# Patient Record
Sex: Female | Born: 1950
Health system: Southern US, Community
[De-identification: ages and names within clinical notes are randomized; demographics above are authoritative.]

## PROBLEM LIST (undated history)

## (undated) DIAGNOSIS — M1990 Unspecified osteoarthritis, unspecified site: Secondary | ICD-10-CM

## (undated) DIAGNOSIS — J45909 Unspecified asthma, uncomplicated: Secondary | ICD-10-CM

## (undated) DIAGNOSIS — R0600 Dyspnea, unspecified: Secondary | ICD-10-CM

## (undated) DIAGNOSIS — K219 Gastro-esophageal reflux disease without esophagitis: Secondary | ICD-10-CM

## (undated) DIAGNOSIS — I10 Essential (primary) hypertension: Secondary | ICD-10-CM

## (undated) HISTORY — PX: TUBAL LIGATION: SHX77

---

## 2014-10-25 DIAGNOSIS — R87619 Unspecified abnormal cytological findings in specimens from cervix uteri: Secondary | ICD-10-CM | POA: Diagnosis not present

## 2014-11-29 DIAGNOSIS — R87619 Unspecified abnormal cytological findings in specimens from cervix uteri: Secondary | ICD-10-CM | POA: Diagnosis not present

## 2015-07-25 DIAGNOSIS — R87610 Atypical squamous cells of undetermined significance on cytologic smear of cervix (ASC-US): Secondary | ICD-10-CM | POA: Diagnosis not present

## 2015-07-25 DIAGNOSIS — R87810 Cervical high risk human papillomavirus (HPV) DNA test positive: Secondary | ICD-10-CM | POA: Diagnosis not present

## 2015-08-05 DIAGNOSIS — I10 Essential (primary) hypertension: Secondary | ICD-10-CM | POA: Diagnosis not present

## 2015-08-05 DIAGNOSIS — Z6830 Body mass index (BMI) 30.0-30.9, adult: Secondary | ICD-10-CM | POA: Diagnosis not present

## 2015-08-05 DIAGNOSIS — M25511 Pain in right shoulder: Secondary | ICD-10-CM | POA: Diagnosis not present

## 2015-08-05 DIAGNOSIS — Z0000 Encounter for general adult medical examination without abnormal findings: Secondary | ICD-10-CM | POA: Diagnosis not present

## 2015-08-05 DIAGNOSIS — E784 Other hyperlipidemia: Secondary | ICD-10-CM | POA: Diagnosis not present

## 2015-08-28 DIAGNOSIS — Z780 Asymptomatic menopausal state: Secondary | ICD-10-CM | POA: Diagnosis not present

## 2015-08-28 DIAGNOSIS — Z7982 Long term (current) use of aspirin: Secondary | ICD-10-CM | POA: Diagnosis not present

## 2015-08-28 DIAGNOSIS — Z79899 Other long term (current) drug therapy: Secondary | ICD-10-CM | POA: Diagnosis not present

## 2015-08-28 DIAGNOSIS — I10 Essential (primary) hypertension: Secondary | ICD-10-CM | POA: Diagnosis not present

## 2015-08-28 DIAGNOSIS — K219 Gastro-esophageal reflux disease without esophagitis: Secondary | ICD-10-CM | POA: Diagnosis not present

## 2015-08-28 DIAGNOSIS — M8589 Other specified disorders of bone density and structure, multiple sites: Secondary | ICD-10-CM | POA: Diagnosis not present

## 2015-08-28 DIAGNOSIS — M810 Age-related osteoporosis without current pathological fracture: Secondary | ICD-10-CM | POA: Diagnosis not present

## 2015-08-28 DIAGNOSIS — J45909 Unspecified asthma, uncomplicated: Secondary | ICD-10-CM | POA: Diagnosis not present

## 2015-09-10 DIAGNOSIS — H2513 Age-related nuclear cataract, bilateral: Secondary | ICD-10-CM | POA: Diagnosis not present

## 2015-09-10 DIAGNOSIS — H538 Other visual disturbances: Secondary | ICD-10-CM | POA: Diagnosis not present

## 2015-11-26 DIAGNOSIS — I10 Essential (primary) hypertension: Secondary | ICD-10-CM | POA: Diagnosis not present

## 2015-11-26 DIAGNOSIS — Z6828 Body mass index (BMI) 28.0-28.9, adult: Secondary | ICD-10-CM | POA: Diagnosis not present

## 2015-11-26 DIAGNOSIS — S43421D Sprain of right rotator cuff capsule, subsequent encounter: Secondary | ICD-10-CM | POA: Diagnosis not present

## 2015-11-26 DIAGNOSIS — E784 Other hyperlipidemia: Secondary | ICD-10-CM | POA: Diagnosis not present

## 2016-02-12 DIAGNOSIS — I10 Essential (primary) hypertension: Secondary | ICD-10-CM | POA: Diagnosis not present

## 2016-02-12 DIAGNOSIS — E784 Other hyperlipidemia: Secondary | ICD-10-CM | POA: Diagnosis not present

## 2016-03-17 DIAGNOSIS — I10 Essential (primary) hypertension: Secondary | ICD-10-CM | POA: Diagnosis not present

## 2016-03-17 DIAGNOSIS — E784 Other hyperlipidemia: Secondary | ICD-10-CM | POA: Diagnosis not present

## 2016-03-30 DIAGNOSIS — Z6829 Body mass index (BMI) 29.0-29.9, adult: Secondary | ICD-10-CM | POA: Diagnosis not present

## 2016-03-30 DIAGNOSIS — I10 Essential (primary) hypertension: Secondary | ICD-10-CM | POA: Diagnosis not present

## 2016-03-30 DIAGNOSIS — E784 Other hyperlipidemia: Secondary | ICD-10-CM | POA: Diagnosis not present

## 2016-04-29 DIAGNOSIS — I10 Essential (primary) hypertension: Secondary | ICD-10-CM | POA: Diagnosis not present

## 2016-04-29 DIAGNOSIS — E784 Other hyperlipidemia: Secondary | ICD-10-CM | POA: Diagnosis not present

## 2016-06-01 DIAGNOSIS — E784 Other hyperlipidemia: Secondary | ICD-10-CM | POA: Diagnosis not present

## 2016-06-01 DIAGNOSIS — I10 Essential (primary) hypertension: Secondary | ICD-10-CM | POA: Diagnosis not present

## 2016-06-11 DIAGNOSIS — E784 Other hyperlipidemia: Secondary | ICD-10-CM | POA: Diagnosis not present

## 2016-06-11 DIAGNOSIS — J4520 Mild intermittent asthma, uncomplicated: Secondary | ICD-10-CM | POA: Diagnosis not present

## 2016-06-11 DIAGNOSIS — I10 Essential (primary) hypertension: Secondary | ICD-10-CM | POA: Diagnosis not present

## 2016-06-11 DIAGNOSIS — Z6829 Body mass index (BMI) 29.0-29.9, adult: Secondary | ICD-10-CM | POA: Diagnosis not present

## 2016-06-29 DIAGNOSIS — J4520 Mild intermittent asthma, uncomplicated: Secondary | ICD-10-CM | POA: Diagnosis not present

## 2016-06-29 DIAGNOSIS — E784 Other hyperlipidemia: Secondary | ICD-10-CM | POA: Diagnosis not present

## 2016-06-29 DIAGNOSIS — I10 Essential (primary) hypertension: Secondary | ICD-10-CM | POA: Diagnosis not present

## 2016-07-28 DIAGNOSIS — E784 Other hyperlipidemia: Secondary | ICD-10-CM | POA: Diagnosis not present

## 2016-07-28 DIAGNOSIS — J4520 Mild intermittent asthma, uncomplicated: Secondary | ICD-10-CM | POA: Diagnosis not present

## 2016-07-28 DIAGNOSIS — I10 Essential (primary) hypertension: Secondary | ICD-10-CM | POA: Diagnosis not present

## 2016-08-20 DIAGNOSIS — E784 Other hyperlipidemia: Secondary | ICD-10-CM | POA: Diagnosis not present

## 2016-08-20 DIAGNOSIS — J4520 Mild intermittent asthma, uncomplicated: Secondary | ICD-10-CM | POA: Diagnosis not present

## 2016-08-20 DIAGNOSIS — I10 Essential (primary) hypertension: Secondary | ICD-10-CM | POA: Diagnosis not present

## 2016-09-16 DIAGNOSIS — E784 Other hyperlipidemia: Secondary | ICD-10-CM | POA: Diagnosis not present

## 2016-09-16 DIAGNOSIS — I10 Essential (primary) hypertension: Secondary | ICD-10-CM | POA: Diagnosis not present

## 2016-09-16 DIAGNOSIS — J4520 Mild intermittent asthma, uncomplicated: Secondary | ICD-10-CM | POA: Diagnosis not present

## 2016-09-22 DIAGNOSIS — J4520 Mild intermittent asthma, uncomplicated: Secondary | ICD-10-CM | POA: Diagnosis not present

## 2016-09-22 DIAGNOSIS — E784 Other hyperlipidemia: Secondary | ICD-10-CM | POA: Diagnosis not present

## 2016-09-22 DIAGNOSIS — Z6829 Body mass index (BMI) 29.0-29.9, adult: Secondary | ICD-10-CM | POA: Diagnosis not present

## 2016-09-22 DIAGNOSIS — I10 Essential (primary) hypertension: Secondary | ICD-10-CM | POA: Diagnosis not present

## 2016-09-22 DIAGNOSIS — Z0000 Encounter for general adult medical examination without abnormal findings: Secondary | ICD-10-CM | POA: Diagnosis not present

## 2016-10-06 DIAGNOSIS — Z6831 Body mass index (BMI) 31.0-31.9, adult: Secondary | ICD-10-CM | POA: Diagnosis not present

## 2016-10-06 DIAGNOSIS — Z01419 Encounter for gynecological examination (general) (routine) without abnormal findings: Secondary | ICD-10-CM | POA: Diagnosis not present

## 2016-10-21 DIAGNOSIS — I10 Essential (primary) hypertension: Secondary | ICD-10-CM | POA: Diagnosis not present

## 2016-10-21 DIAGNOSIS — E784 Other hyperlipidemia: Secondary | ICD-10-CM | POA: Diagnosis not present

## 2016-12-01 DIAGNOSIS — I10 Essential (primary) hypertension: Secondary | ICD-10-CM | POA: Diagnosis not present

## 2016-12-01 DIAGNOSIS — E784 Other hyperlipidemia: Secondary | ICD-10-CM | POA: Diagnosis not present

## 2016-12-24 DIAGNOSIS — J4520 Mild intermittent asthma, uncomplicated: Secondary | ICD-10-CM | POA: Diagnosis not present

## 2016-12-24 DIAGNOSIS — Z6830 Body mass index (BMI) 30.0-30.9, adult: Secondary | ICD-10-CM | POA: Diagnosis not present

## 2016-12-24 DIAGNOSIS — I10 Essential (primary) hypertension: Secondary | ICD-10-CM | POA: Diagnosis not present

## 2016-12-24 DIAGNOSIS — E784 Other hyperlipidemia: Secondary | ICD-10-CM | POA: Diagnosis not present

## 2016-12-30 DIAGNOSIS — I10 Essential (primary) hypertension: Secondary | ICD-10-CM | POA: Diagnosis not present

## 2016-12-30 DIAGNOSIS — E784 Other hyperlipidemia: Secondary | ICD-10-CM | POA: Diagnosis not present

## 2017-03-23 DIAGNOSIS — E784 Other hyperlipidemia: Secondary | ICD-10-CM | POA: Diagnosis not present

## 2017-03-23 DIAGNOSIS — I10 Essential (primary) hypertension: Secondary | ICD-10-CM | POA: Diagnosis not present

## 2017-04-05 DIAGNOSIS — J4520 Mild intermittent asthma, uncomplicated: Secondary | ICD-10-CM | POA: Diagnosis not present

## 2017-04-05 DIAGNOSIS — I10 Essential (primary) hypertension: Secondary | ICD-10-CM | POA: Diagnosis not present

## 2017-04-05 DIAGNOSIS — E784 Other hyperlipidemia: Secondary | ICD-10-CM | POA: Diagnosis not present

## 2017-04-05 DIAGNOSIS — Z6831 Body mass index (BMI) 31.0-31.9, adult: Secondary | ICD-10-CM | POA: Diagnosis not present

## 2017-05-19 DIAGNOSIS — I10 Essential (primary) hypertension: Secondary | ICD-10-CM | POA: Diagnosis not present

## 2017-05-19 DIAGNOSIS — E7849 Other hyperlipidemia: Secondary | ICD-10-CM | POA: Diagnosis not present

## 2017-06-17 DIAGNOSIS — E7849 Other hyperlipidemia: Secondary | ICD-10-CM | POA: Diagnosis not present

## 2017-06-17 DIAGNOSIS — I10 Essential (primary) hypertension: Secondary | ICD-10-CM | POA: Diagnosis not present

## 2017-06-23 DIAGNOSIS — M1712 Unilateral primary osteoarthritis, left knee: Secondary | ICD-10-CM | POA: Diagnosis not present

## 2017-06-23 DIAGNOSIS — M25562 Pain in left knee: Secondary | ICD-10-CM | POA: Diagnosis not present

## 2017-07-20 DIAGNOSIS — I10 Essential (primary) hypertension: Secondary | ICD-10-CM | POA: Diagnosis not present

## 2017-07-20 DIAGNOSIS — E7849 Other hyperlipidemia: Secondary | ICD-10-CM | POA: Diagnosis not present

## 2017-08-04 DIAGNOSIS — J4520 Mild intermittent asthma, uncomplicated: Secondary | ICD-10-CM | POA: Diagnosis not present

## 2017-08-04 DIAGNOSIS — Z6832 Body mass index (BMI) 32.0-32.9, adult: Secondary | ICD-10-CM | POA: Diagnosis not present

## 2017-08-04 DIAGNOSIS — I10 Essential (primary) hypertension: Secondary | ICD-10-CM | POA: Diagnosis not present

## 2017-08-04 DIAGNOSIS — E7849 Other hyperlipidemia: Secondary | ICD-10-CM | POA: Diagnosis not present

## 2017-08-24 DIAGNOSIS — E7849 Other hyperlipidemia: Secondary | ICD-10-CM | POA: Diagnosis not present

## 2017-08-24 DIAGNOSIS — J4520 Mild intermittent asthma, uncomplicated: Secondary | ICD-10-CM | POA: Diagnosis not present

## 2017-08-24 DIAGNOSIS — I10 Essential (primary) hypertension: Secondary | ICD-10-CM | POA: Diagnosis not present

## 2017-09-22 DIAGNOSIS — J4520 Mild intermittent asthma, uncomplicated: Secondary | ICD-10-CM | POA: Diagnosis not present

## 2017-09-22 DIAGNOSIS — E7849 Other hyperlipidemia: Secondary | ICD-10-CM | POA: Diagnosis not present

## 2017-09-22 DIAGNOSIS — I10 Essential (primary) hypertension: Secondary | ICD-10-CM | POA: Diagnosis not present

## 2017-10-21 DIAGNOSIS — M25552 Pain in left hip: Secondary | ICD-10-CM | POA: Diagnosis not present

## 2017-10-21 DIAGNOSIS — M1712 Unilateral primary osteoarthritis, left knee: Secondary | ICD-10-CM | POA: Diagnosis not present

## 2017-10-27 DIAGNOSIS — E7849 Other hyperlipidemia: Secondary | ICD-10-CM | POA: Diagnosis not present

## 2017-10-27 DIAGNOSIS — I10 Essential (primary) hypertension: Secondary | ICD-10-CM | POA: Diagnosis not present

## 2017-10-27 DIAGNOSIS — J4520 Mild intermittent asthma, uncomplicated: Secondary | ICD-10-CM | POA: Diagnosis not present

## 2017-11-09 DIAGNOSIS — E7849 Other hyperlipidemia: Secondary | ICD-10-CM | POA: Diagnosis not present

## 2017-11-09 DIAGNOSIS — I10 Essential (primary) hypertension: Secondary | ICD-10-CM | POA: Diagnosis not present

## 2017-11-09 DIAGNOSIS — Z0000 Encounter for general adult medical examination without abnormal findings: Secondary | ICD-10-CM | POA: Diagnosis not present

## 2017-11-09 DIAGNOSIS — J4520 Mild intermittent asthma, uncomplicated: Secondary | ICD-10-CM | POA: Diagnosis not present

## 2017-11-09 DIAGNOSIS — Z6831 Body mass index (BMI) 31.0-31.9, adult: Secondary | ICD-10-CM | POA: Diagnosis not present

## 2017-11-09 DIAGNOSIS — Z1389 Encounter for screening for other disorder: Secondary | ICD-10-CM | POA: Diagnosis not present

## 2017-11-17 DIAGNOSIS — I10 Essential (primary) hypertension: Secondary | ICD-10-CM | POA: Diagnosis not present

## 2017-11-17 DIAGNOSIS — J4520 Mild intermittent asthma, uncomplicated: Secondary | ICD-10-CM | POA: Diagnosis not present

## 2017-11-17 DIAGNOSIS — E7849 Other hyperlipidemia: Secondary | ICD-10-CM | POA: Diagnosis not present

## 2017-12-16 DIAGNOSIS — M810 Age-related osteoporosis without current pathological fracture: Secondary | ICD-10-CM | POA: Diagnosis not present

## 2017-12-16 DIAGNOSIS — M8588 Other specified disorders of bone density and structure, other site: Secondary | ICD-10-CM | POA: Diagnosis not present

## 2018-01-31 DIAGNOSIS — E7849 Other hyperlipidemia: Secondary | ICD-10-CM | POA: Diagnosis not present

## 2018-01-31 DIAGNOSIS — I10 Essential (primary) hypertension: Secondary | ICD-10-CM | POA: Diagnosis not present

## 2018-01-31 DIAGNOSIS — J4520 Mild intermittent asthma, uncomplicated: Secondary | ICD-10-CM | POA: Diagnosis not present

## 2018-02-01 DIAGNOSIS — J45909 Unspecified asthma, uncomplicated: Secondary | ICD-10-CM | POA: Diagnosis not present

## 2018-02-01 DIAGNOSIS — Z79899 Other long term (current) drug therapy: Secondary | ICD-10-CM | POA: Diagnosis not present

## 2018-02-01 DIAGNOSIS — D6949 Other primary thrombocytopenia: Secondary | ICD-10-CM | POA: Diagnosis not present

## 2018-02-01 DIAGNOSIS — K219 Gastro-esophageal reflux disease without esophagitis: Secondary | ICD-10-CM | POA: Diagnosis not present

## 2018-02-01 DIAGNOSIS — M1990 Unspecified osteoarthritis, unspecified site: Secondary | ICD-10-CM | POA: Diagnosis not present

## 2018-02-01 DIAGNOSIS — K5289 Other specified noninfective gastroenteritis and colitis: Secondary | ICD-10-CM | POA: Diagnosis not present

## 2018-02-01 DIAGNOSIS — D696 Thrombocytopenia, unspecified: Secondary | ICD-10-CM | POA: Diagnosis not present

## 2018-02-01 DIAGNOSIS — I10 Essential (primary) hypertension: Secondary | ICD-10-CM | POA: Diagnosis not present

## 2018-02-01 DIAGNOSIS — K8020 Calculus of gallbladder without cholecystitis without obstruction: Secondary | ICD-10-CM | POA: Diagnosis not present

## 2018-02-01 DIAGNOSIS — E785 Hyperlipidemia, unspecified: Secondary | ICD-10-CM | POA: Diagnosis not present

## 2018-02-01 DIAGNOSIS — M150 Primary generalized (osteo)arthritis: Secondary | ICD-10-CM | POA: Diagnosis not present

## 2018-02-01 DIAGNOSIS — K529 Noninfective gastroenteritis and colitis, unspecified: Secondary | ICD-10-CM | POA: Diagnosis not present

## 2018-02-02 DIAGNOSIS — J45909 Unspecified asthma, uncomplicated: Secondary | ICD-10-CM | POA: Diagnosis not present

## 2018-02-02 DIAGNOSIS — M150 Primary generalized (osteo)arthritis: Secondary | ICD-10-CM | POA: Diagnosis not present

## 2018-02-02 DIAGNOSIS — Z79899 Other long term (current) drug therapy: Secondary | ICD-10-CM | POA: Diagnosis not present

## 2018-02-02 DIAGNOSIS — K5289 Other specified noninfective gastroenteritis and colitis: Secondary | ICD-10-CM | POA: Diagnosis not present

## 2018-02-02 DIAGNOSIS — D6949 Other primary thrombocytopenia: Secondary | ICD-10-CM | POA: Diagnosis not present

## 2018-02-02 DIAGNOSIS — D696 Thrombocytopenia, unspecified: Secondary | ICD-10-CM | POA: Diagnosis not present

## 2018-02-02 DIAGNOSIS — M1990 Unspecified osteoarthritis, unspecified site: Secondary | ICD-10-CM | POA: Diagnosis not present

## 2018-02-02 DIAGNOSIS — K8020 Calculus of gallbladder without cholecystitis without obstruction: Secondary | ICD-10-CM | POA: Diagnosis not present

## 2018-02-02 DIAGNOSIS — E785 Hyperlipidemia, unspecified: Secondary | ICD-10-CM | POA: Diagnosis not present

## 2018-02-02 DIAGNOSIS — K219 Gastro-esophageal reflux disease without esophagitis: Secondary | ICD-10-CM | POA: Diagnosis not present

## 2018-02-02 DIAGNOSIS — K529 Noninfective gastroenteritis and colitis, unspecified: Secondary | ICD-10-CM | POA: Diagnosis not present

## 2018-02-02 DIAGNOSIS — I10 Essential (primary) hypertension: Secondary | ICD-10-CM | POA: Diagnosis not present

## 2018-02-03 DIAGNOSIS — J45909 Unspecified asthma, uncomplicated: Secondary | ICD-10-CM | POA: Diagnosis not present

## 2018-02-03 DIAGNOSIS — K5289 Other specified noninfective gastroenteritis and colitis: Secondary | ICD-10-CM | POA: Diagnosis not present

## 2018-02-03 DIAGNOSIS — M150 Primary generalized (osteo)arthritis: Secondary | ICD-10-CM | POA: Diagnosis not present

## 2018-02-03 DIAGNOSIS — D6949 Other primary thrombocytopenia: Secondary | ICD-10-CM | POA: Diagnosis not present

## 2018-02-03 DIAGNOSIS — E785 Hyperlipidemia, unspecified: Secondary | ICD-10-CM | POA: Diagnosis not present

## 2018-02-03 DIAGNOSIS — K8020 Calculus of gallbladder without cholecystitis without obstruction: Secondary | ICD-10-CM | POA: Diagnosis not present

## 2018-02-03 DIAGNOSIS — M1990 Unspecified osteoarthritis, unspecified site: Secondary | ICD-10-CM | POA: Diagnosis not present

## 2018-02-03 DIAGNOSIS — K529 Noninfective gastroenteritis and colitis, unspecified: Secondary | ICD-10-CM | POA: Diagnosis not present

## 2018-02-03 DIAGNOSIS — K219 Gastro-esophageal reflux disease without esophagitis: Secondary | ICD-10-CM | POA: Diagnosis not present

## 2018-02-03 DIAGNOSIS — Z79899 Other long term (current) drug therapy: Secondary | ICD-10-CM | POA: Diagnosis not present

## 2018-02-03 DIAGNOSIS — I10 Essential (primary) hypertension: Secondary | ICD-10-CM | POA: Diagnosis not present

## 2018-02-03 DIAGNOSIS — D696 Thrombocytopenia, unspecified: Secondary | ICD-10-CM | POA: Diagnosis not present

## 2018-02-06 DIAGNOSIS — I10 Essential (primary) hypertension: Secondary | ICD-10-CM | POA: Diagnosis not present

## 2018-02-06 DIAGNOSIS — M1990 Unspecified osteoarthritis, unspecified site: Secondary | ICD-10-CM | POA: Diagnosis not present

## 2018-02-06 DIAGNOSIS — K8020 Calculus of gallbladder without cholecystitis without obstruction: Secondary | ICD-10-CM | POA: Diagnosis not present

## 2018-02-06 DIAGNOSIS — K529 Noninfective gastroenteritis and colitis, unspecified: Secondary | ICD-10-CM | POA: Diagnosis not present

## 2018-02-06 DIAGNOSIS — E785 Hyperlipidemia, unspecified: Secondary | ICD-10-CM | POA: Diagnosis not present

## 2018-02-06 DIAGNOSIS — J45909 Unspecified asthma, uncomplicated: Secondary | ICD-10-CM | POA: Diagnosis not present

## 2018-02-06 DIAGNOSIS — K219 Gastro-esophageal reflux disease without esophagitis: Secondary | ICD-10-CM | POA: Diagnosis not present

## 2018-02-06 DIAGNOSIS — Z79899 Other long term (current) drug therapy: Secondary | ICD-10-CM | POA: Diagnosis not present

## 2018-02-06 DIAGNOSIS — D696 Thrombocytopenia, unspecified: Secondary | ICD-10-CM | POA: Diagnosis not present

## 2018-02-07 ENCOUNTER — Emergency Department (HOSPITAL_COMMUNITY): Payer: Medicare HMO

## 2018-02-07 ENCOUNTER — Encounter (HOSPITAL_COMMUNITY): Payer: Self-pay | Admitting: *Deleted

## 2018-02-07 ENCOUNTER — Other Ambulatory Visit: Payer: Self-pay

## 2018-02-07 ENCOUNTER — Emergency Department (HOSPITAL_COMMUNITY)
Admission: EM | Admit: 2018-02-07 | Discharge: 2018-02-07 | Disposition: A | Discharge status: Home / Self Care | Payer: Medicare HMO | Attending: Emergency Medicine | Admitting: Emergency Medicine

## 2018-02-07 DIAGNOSIS — R1011 Right upper quadrant pain: Secondary | ICD-10-CM

## 2018-02-07 DIAGNOSIS — I10 Essential (primary) hypertension: Secondary | ICD-10-CM | POA: Insufficient documentation

## 2018-02-07 DIAGNOSIS — K8020 Calculus of gallbladder without cholecystitis without obstruction: Secondary | ICD-10-CM | POA: Diagnosis not present

## 2018-02-07 DIAGNOSIS — R112 Nausea with vomiting, unspecified: Secondary | ICD-10-CM | POA: Diagnosis not present

## 2018-02-07 DIAGNOSIS — R638 Other symptoms and signs concerning food and fluid intake: Secondary | ICD-10-CM | POA: Diagnosis not present

## 2018-02-07 HISTORY — DX: Gastro-esophageal reflux disease without esophagitis: K21.9

## 2018-02-07 HISTORY — DX: Essential (primary) hypertension: I10

## 2018-02-07 LAB — COMPREHENSIVE METABOLIC PANEL
ALBUMIN: 3.9 g/dL (ref 3.5–5.0)
ALT: 55 U/L — ABNORMAL HIGH (ref 0–44)
ANION GAP: 11 (ref 5–15)
AST: 82 U/L — ABNORMAL HIGH (ref 15–41)
Alkaline Phosphatase: 69 U/L (ref 38–126)
BUN: 9 mg/dL (ref 8–23)
CO2: 28 mmol/L (ref 22–32)
Calcium: 9.1 mg/dL (ref 8.9–10.3)
Chloride: 98 mmol/L (ref 98–111)
Creatinine, Ser: 0.77 mg/dL (ref 0.44–1.00)
GFR calc Af Amer: >60 mL/min (ref >60)
GFR calc non Af Amer: >60 mL/min (ref >60)
GLUCOSE: 91 mg/dL (ref 70–99)
POTASSIUM: 3.1 mmol/L — AB (ref 3.5–5.1)
SODIUM: 137 mmol/L (ref 135–145)
Total Bilirubin: 0.8 mg/dL (ref 0.3–1.2)
Total Protein: 7.3 g/dL (ref 6.5–8.1)

## 2018-02-07 LAB — URINALYSIS, ROUTINE W REFLEX MICROSCOPIC
Glucose, UA: NEGATIVE mg/dL
HGB URINE DIPSTICK: NEGATIVE
KETONES UR: 80 mg/dL — AB
NITRITE: NEGATIVE
Protein, ur: 100 mg/dL — AB
Specific Gravity, Urine: 1.029 (ref 1.005–1.030)
pH: 6 (ref 5.0–8.0)

## 2018-02-07 LAB — CBC
HEMATOCRIT: 43.4 % (ref 36.0–46.0)
HEMOGLOBIN: 13.3 g/dL (ref 12.0–15.0)
MCH: 22.5 pg — AB (ref 26.0–34.0)
MCHC: 30.6 g/dL (ref 30.0–36.0)
MCV: 73.3 fL — ABNORMAL LOW (ref 78.0–100.0)
Platelets: 300 10*3/uL (ref 150–400)
RBC: 5.92 MIL/uL — ABNORMAL HIGH (ref 3.87–5.11)
RDW: 13.5 % (ref 11.5–15.5)
WBC: 7.9 10*3/uL (ref 4.0–10.5)

## 2018-02-07 LAB — LIPASE, BLOOD: LIPASE: 30 U/L (ref 11–51)

## 2018-02-07 MED ORDER — HYDROCODONE-ACETAMINOPHEN 5-325 MG PO TABS: 1.0000 | ORAL_TABLET | Freq: Once | ORAL | Status: DC

## 2018-02-07 MED ORDER — LACTATED RINGERS IV BOLUS
1000.0000 mL | Freq: Once | INTRAVENOUS | Status: AC
  Administered 2018-02-07: 1000 mL via INTRAVENOUS

## 2018-02-07 MED ORDER — MORPHINE SULFATE (PF) 4 MG/ML IV SOLN
4.0000 mg | Freq: Once | INTRAVENOUS | Status: AC
  Administered 2018-02-07: 4 mg via INTRAVENOUS
  Filled 2018-02-07: qty 1

## 2018-02-07 MED ORDER — IOHEXOL 300 MG/ML  SOLN
100.0000 mL | Freq: Once | INTRAMUSCULAR | Status: AC | PRN
  Administered 2018-02-07: 100 mL via INTRAVENOUS

## 2018-02-07 MED ORDER — ONDANSETRON 4 MG PO TBDP
4.0000 mg | ORAL_TABLET | Freq: Once | ORAL | Status: AC
Start: 2018-02-07 — End: 2018-02-07
  Administered 2018-02-07: 4 mg via ORAL
  Filled 2018-02-07: qty 1

## 2018-02-07 MED ORDER — ONDANSETRON HCL 4 MG PO TABS: 4.0000 mg | ORAL_TABLET | Freq: Three times a day (TID) | ORAL | 0 refills | Status: AC | PRN

## 2018-02-07 NOTE — ED Provider Notes (Signed)
[facility] EMERGENCY DEPARTMENT Provider Note   CSN: [id number] Arrival date & time: 02/07/18  1600     History   Chief Complaint Chief Complaint  Patient presents with  . Diarrhea    HPI Roberta Campos is a 67 y.o. female.  HPI Roberta Campos is a 67 year old female with a history of hypertension and GERD who presents due to 3 to 4 days of progressively worsening abdominal pain.  She says that she has diffuse abdominal pain, mostly in the right upper quadrant.  Her pain is dull and constant.  It is exacerbated by eating.  She says that she was evaluated at [facility] this weekend.  There, she received an ultrasound which showed a gallstone but no cholecystitis she says.  She did not receive a CT scan at the outside hospital.  She denies fever.  She reports having chills.  She says that she has had decreased p.o. tolerance.  She reports having nausea and vomiting.  She denies chest pain and shortness of breath.  Her only prior abdominal surgery is tubal ligation. Past Medical History:  Diagnosis Date  . GERD (gastroesophageal reflux disease)   . Hypertension     There are no active problems to display for this patient.   History reviewed. No pertinent surgical history.   OB History   None      Home Medications    Prior to Admission medications   Medication Sig Start Date End Date Taking? Authorizing Provider  ondansetron (ZOFRAN) 4 MG tablet Take 1 tablet (4 mg total) by mouth every 8 (eight) hours as needed for up to 7 days for nausea or vomiting. 02/07/18 02/14/18  Talitha Givens, MD    Family History No family history on file.  Social History Social History   Tobacco Use  . Smoking status: Never Smoker  . Smokeless tobacco: Never Used  Substance Use Topics  . Alcohol use: Not Currently  . Drug use: Not Currently     Allergies   Patient has no allergy information on record.   Review of Systems Review of Systems Review of Systems    Constitutional  Negative for fever  +for chills  HENT  Negative for ear pain  Negative for sore throat  Negative for difficultly swallowing  Eyes  Negative for eye pain  Negative for visual disturbance  Respiratory  Negative for shortness of breath  Negative for cough  CV  Negative for chest pain  Negative for leg swelling  Abdomen  +for abdominal pain  +for nausea  +for vomiting  MSK  Negative for extremity pain  Negative for back pain  Skin  Negative for rash  Negative for wound  Neuro  Negative for syncope  Negative for difficultly speaking  Psych  Negative for confusion   The remainder of the ROS was reviewed and negative except as documented above.      Physical Exam Updated Vital Signs BP 122/73   Pulse 78   Temp 97.9 F (36.6 C) (Oral)   Resp 18   Ht 5\' 2"  (1.575 m)   Wt 77.1 kg (170 lb)   SpO2 98%   BMI 31.09 kg/m   Physical Exam Physical Exam Constitutional  Nursing notes reviewed  Vital signs reviewed  HEENT  No obvious trauma  Supple without meningismus, mass, or overt JVD  EOMI  No scleral icterus or injection  Respiratory  Effort normal  CTAB  No respiratory distress  CV  Normal rate  No  obvious murmurs  No pitting edema  Abdomen  Soft  Mild diffuse tenderness, mostly in the RUQ  No Murphy sign  Non-distended  No peritonitis  MSK  Atraumatic  No obvious deformity  ROM appropriate  Skin  Warm  Dry  Neuro  Awake and alert  EOMI  Moving all extremities  Psychiatric  Mood and affect normal          ED Treatments / Results  Labs (all labs ordered are listed, but only abnormal results are displayed) Labs Reviewed  COMPREHENSIVE METABOLIC PANEL - Abnormal; Notable for the following components:      Result Value   Potassium 3.1 (*)    AST 82 (*)    ALT 55 (*)    All other components within normal limits  CBC - Abnormal; Notable for the following components:   RBC 5.92 (*)     MCV 73.3 (*)    MCH 22.5 (*)    All other components within normal limits  URINALYSIS, ROUTINE W REFLEX MICROSCOPIC - Abnormal; Notable for the following components:   Color, Urine AMBER (*)    Bilirubin Urine MODERATE (*)    Ketones, ur 80 (*)    Protein, ur 100 (*)    Leukocytes, UA TRACE (*)    Bacteria, UA RARE (*)    All other components within normal limits  LIPASE, BLOOD    EKG None The ECG revealed:   A sinus rhythm with a ventricular rate of 75.  QTc 434, QRS 88  No STEMI  No ST depressions  Non-specific T wave changes in II and V2-V3  No prior ECGs for comparison  There is no evidence of:   High-Grade Conduction Blocks   WPW   Long QT Syndrome   Significant LVH   Brugada Syndrome   Arrhythmogenic Right Ventricular Dysplasia   Wellens Waves   DeWinters T Waves   Right heart strain.  Radiology Ct Abdomen Pelvis W Contrast  Result Date: 02/07/2018 CLINICAL DATA:  Acute abdominal pain. Worsening pain for 1 week, worse in the right upper quadrant. EXAM: CT ABDOMEN AND PELVIS WITH CONTRAST TECHNIQUE: Multidetector CT imaging of the abdomen and pelvis was performed using the standard protocol following bolus administration of intravenous contrast. CONTRAST:  OMNIPAQUE IOHEXOL 300 MG/ML  SOLN COMPARISON:  Right upper quadrant ultrasound earlier this day. FINDINGS: Lower chest: Distended fluid-filled included esophagus with wall thickening, transition of the gastroesophageal junction. Right lower lobe atelectasis or scarring adjacent to dilated esophagus. Lingular opacity may be atelectasis or scarring. Hepatobiliary: No focal hepatic lesion. Mild steatosis. Gallstones in the gallbladder without pericholecystic inflammation. No biliary dilatation. Pancreas: No ductal dilatation or inflammation. Spleen: Elongated spanning 12.9 cm.  No focal splenic abnormality. Adrenals/Urinary Tract: Normal adrenal glands. Prominence of the right renal pelvis without calyceal dilatation  consistent without chin renal pelvis configuration. No perinephric edema. Homogeneous renal enhancement. Small cyst in the upper left kidney. Ureters are decompressed. Urinary bladder is nondistended. No bladder wall thickening. Stomach/Bowel: Stomach is nondistended limiting assessment. No small or large bowel wall thickening, inflammatory change or obstruction. Small volume of colonic stool. Normal appendix. Vascular/Lymphatic: Moderate to advanced aortic atherosclerosis. No aneurysm. Prominent left adnexal and periuterine vascularity with dilatation of the left ovarian vein measuring 9 mm. Small retrocrural nodes measure up to 6 mm, nonspecific. No enlarged abdominal or pelvic lymph nodes. Reproductive: Increased left adnexal and periuterine vascularity. Uterus is otherwise unremarkable. No adnexal mass. Other: No free air, free fluid, or intra-abdominal  fluid collection. Musculoskeletal: There are no acute or suspicious osseous abnormalities. Degenerative disc disease at L5-S1. IMPRESSION: 1. Dilated fluid-filled esophagus with transition at the gastroesophageal junction. Findings may be secondary to achalasia, distal esophageal structure or focal mass not visualized by CT. Recommend endoscopic evaluation. 2. Increased left adnexal vascularity and dilatation of the ovarian veins, can be seen with pelvic congestion syndrome. 3. Cholelithiasis.  Aortic Atherosclerosis (ICD10-I70.0). Electronically Signed   By: Rubye OaksMelanie  Ehinger M.D.   On: 02/07/2018 22:56   [place] Abdomen Limited Ruq  Result Date: 02/07/2018 CLINICAL DATA:  RIGHT upper quadrant pain. EXAM: ULTRASOUND ABDOMEN LIMITED RIGHT UPPER QUADRANT COMPARISON:  [facility]. FINDINGS: Gallbladder: The gallbladder is adequately distended. There is a stone, measuring 2.4 cm in longest dimension, with prominent distal shadowing. There is no gallbladder wall thickening, pericholecystic fluid or sonographic Murphy's sign. Common bile  duct: Diameter: 2.5 mm Liver: Increased echogenicity. No focal masses. Portal vein is patent on color Doppler imaging with normal direction of blood flow towards the liver. IMPRESSION: Cholelithiasis without signs of acute cholecystitis. Similar appearance to sonographic evaluation on 02/02/2018. Electronically Signed   By: Elsie StainJohn T Curnes M.D.   On: 02/07/2018 18:05    Procedures Procedures (including critical care time)  Medications Ordered in ED Medications  ondansetron (ZOFRAN-ODT) disintegrating tablet 4 mg (4 mg Oral Given 02/07/18 1711)  lactated ringers bolus 1,000 mL (1,000 mLs Intravenous New Bag/Given 02/07/18 2118)  morphine 4 MG/ML injection 4 mg (4 mg Intravenous Given 02/07/18 2118)  iohexol (OMNIPAQUE) 300 MG/ML solution 100 mL (100 mLs Intravenous Contrast Given 02/07/18 2220)     Initial Impression / Assessment and Plan / ED Course  I have reviewed the triage vital signs and the nursing notes.  Pertinent labs & imaging results that were available during my care of the patient were reviewed by me and considered in my medical decision making (see chart for details).    Roberta Campos presents with abdominal pain as per above.  She is hemodyamically stable.  She has mild diffuse tenderness.  She was recently diagnosed with cholelithiasis without evidence of cholecystitis.  Her labs reveal a normal lipase and mildly elevated liver enzymes. She denies dysuria and her UA is not consistent with a UTI.  I have a very low suspicion for MI, pancreatitis, ruptured viscus, SBO, aortic dissection, diverticulitis, and appendicitis.  To further stratify her, and ECG and a CT abdomen pelvis ordered.  The ECG revealed no acute ischemic changes as per above.  Her CT scan revealed cholelithiasis and possible achalasia.  She was given oral Zofran and IV fluids while in the ED.  On reassessment, she appeared improved and remained stable.  Given her results and intermittently worsening abdominal  pain, I believe that outpatient followup with surgery is appropriate.  She was also referred for outpatient GI followup due to concern for possible achalasia vs esophageal stricture/mass on CT.  She agreed with this plan.  She felt safe being discharged.  I provided her with a limited supply of Zofran for home use.  The care of this patient was supervised by Dr. Patria Maneampos, who agreed with the plan and management of the patient.   Final Clinical Impressions(s) / ED Diagnoses   Final diagnoses:  RUQ pain  Calculus of gallbladder without cholecystitis without obstruction    ED Discharge Orders        Ordered    ondansetron (ZOFRAN) 4 MG tablet  Every 8 hours PRN  02/07/18 2315       Talitha Givens, MD 02/07/18 Angela Nevin    Azalia Bilis, MD 02/07/18 [phone]

## 2018-02-07 NOTE — Discharge Instructions (Addendum)
Roberta Campos:  Thank you for allowing us to take care of you today.  We hope you begin feeling better soon.  To-Do: Please follow-up with the surgery team and gastroenterology regarding your abdominal pain. Take Zofran as needed Please return to the Emergency Department or call 911 if you experience chest pain, shortness of breath, severe pain, severe fever, altered mental status, or have any reason to think that you need emergency medical care.  Thank you again.  Hope you feel better soon.

## 2018-02-07 NOTE — ED Triage Notes (Signed)
Pt in from home c/o n/v/d onset since last Friday, pt reports loose stools x 2 today, pt reports vomiting once today with no appetite, pt A&O x4

## 2018-02-07 NOTE — ED Provider Notes (Signed)
Patient placed in Quick Look pathway, seen and evaluated   Chief Complaint: Abdominal pain, N/V/D  HPI:   Patient reports for the last week she has had worsening abdominal pain, worse in the right upper quadrant, with watery stools, no melena or hematochezia.  She reports reduced appetite, nausea and intermittent vomiting, no hematemesis.  [facility] and told she had gallstones, but no evidence of cholecystitis at this time, the ligation but no other belly surgeries.  ROS: + Abdominal pain, nausea, vomiting, diarrhea, decreased appetite. -Chest pain, shortness of breath, melena, hematochezia, urinary symptoms.  Physical Exam:   Gen: No distress  Neuro: Awake and Alert  Skin: Warm    Focused Exam: Abdomen with mild generalized tenderness, focal tenderness with guarding noted in the right upper quadrant, positive Murphy sign  Initiation of care has begun. The patient has been counseled on the process, plan, and necessity for staying for the completion/evaluation, and the remainder of the medical screening examination    Legrand RamsFord, Kelsey N, PA-C 02/07/18 1653    Azalia Bilisampos, Kevin, MD 02/07/18 [id number]

## 2018-02-15 DIAGNOSIS — Z6830 Body mass index (BMI) 30.0-30.9, adult: Secondary | ICD-10-CM | POA: Diagnosis not present

## 2018-02-15 DIAGNOSIS — K521 Toxic gastroenteritis and colitis: Secondary | ICD-10-CM | POA: Diagnosis not present

## 2018-02-21 ENCOUNTER — Ambulatory Visit: Payer: Self-pay | Admitting: General Surgery

## 2018-02-21 DIAGNOSIS — K8020 Calculus of gallbladder without cholecystitis without obstruction: Secondary | ICD-10-CM | POA: Diagnosis not present

## 2018-02-21 NOTE — H&P (View-Only) (Signed)
History of Present Illness Axel Filler(Jayah Balthazar MD; 02/21/2018 1:45 PM) The patient is a 67 year old female who presents for evaluation of gall stones. Referred by: Dr. Azalia BilisKevin campos Chief Complaint: Abdominal pain  Patient is a 67 year old female with a history of hypertension, reflux, who comes in with a 2 to three-week history of abdominal pain. Patient states this or recurrence. Patient states she's never had previous occurrences. Patient does state that the pain began in the daytime. She states this lasted for 20 for 48 hours. This was followed by some soreness. She states that she had some nausea, diarrhea. She presented to the ER secondary generalized abdominal pain. Patient workup which didn't reveal ultrasound as well as CT scans. Reviewed these personally. This didn't reveal gallstones however no signs of acute cholecystitis. Patient's laboratory values were within normal limits.     Past Surgical History Adela Lank(Jacqueline Haggett; 02/21/2018 1:29 PM) Cesarean Section - Multiple  Colon Polyp Removal - Colonoscopy   Diagnostic Studies History Adela Lank(Jacqueline Haggett; 02/21/2018 1:29 PM) Colonoscopy  5-10 years ago Mammogram  1-3 years ago Pap Smear  1-5 years ago  Allergies Adela Lank(Jacqueline Haggett; 02/21/2018 1:30 PM) No Known Drug Allergies [02/21/2018]: Allergies Reconciled   Medication History (Jacqueline Haggett; 02/21/2018 1:32 PM) Losartan Potassium (50MG  Tablet, Oral) Active. Losartan Potassium-HCTZ (100-25MG  Tablet, Oral) Active. Lovastatin (20MG  Tablet, Oral) Active. Ondansetron HCl (4MG  Tablet, Oral) Active. Tylenol (Oral) Specific strength unknown - Active. Cetirizine HCl (10MG  Tablet, Oral) Active. Aspirin (81MG  Tablet DR, Oral) Active. Fish Oil (Oral) Specific strength unknown - Active. Medications Reconciled Calcium (Oral) Specific strength unknown - Active. Ventolin HFA (108 (90 Base)MCG/ACT Aerosol Soln, Inhalation) Active.  Social History  Adela Lank(Jacqueline Haggett; 02/21/2018 1:29 PM) Caffeine use  Carbonated beverages, Tea. No alcohol use  No drug use  Tobacco use  Never smoker.  Family History Adela Lank(Jacqueline Haggett; 02/21/2018 1:29 PM) Alcohol Abuse  Father. Arthritis  Sister. Breast Cancer  Mother. Diabetes Mellitus  Brother, Mother, Sister. Heart Disease  Mother, Sister.  Pregnancy / Birth History Adela Lank(Jacqueline Haggett; 02/21/2018 1:29 PM) Age at menarche  13 years. Age of menopause  6651-55 Gravida  5 Irregular periods  Maternal age  68-20 Para  6  Other Problems Adela Lank(Jacqueline Haggett; 02/21/2018 1:29 PM) Arthritis  Asthma  Cholelithiasis  Gastroesophageal Reflux Disease  High blood pressure  Hypercholesterolemia     Review of Systems Axel Filler(Stacie Knutzen MD; 02/21/2018 1:43 PM) General Not Present- Appetite Loss, Chills, Fatigue, Fever, Night Sweats, Weight Gain and Weight Loss. Skin Not Present- Change in Wart/Mole, Dryness, Hives, Jaundice, New Lesions, Non-Healing Wounds, Rash and Ulcer. HEENT Present- Seasonal Allergies and Wears glasses/contact lenses. Not Present- Earache, Hearing Loss, Hoarseness, Nose Bleed, Oral Ulcers, Ringing in the Ears, Sinus Pain, Sore Throat, Visual Disturbances and Yellow Eyes. Respiratory Not Present- Bloody sputum, Chronic Cough, Difficulty Breathing, Snoring and Wheezing. Breast Not Present- Breast Mass, Breast Pain, Nipple Discharge and Skin Changes. Cardiovascular Not Present- Chest Pain, Difficulty Breathing Lying Down, Leg Cramps, Palpitations, Rapid Heart Rate, Shortness of Breath and Swelling of Extremities. Gastrointestinal Present- Indigestion and Vomiting. Not Present- Abdominal Pain, Bloating, Bloody Stool, Change in Bowel Habits, Chronic diarrhea, Constipation, Difficulty Swallowing, Excessive gas, Gets full quickly at meals, Hemorrhoids, Nausea and Rectal Pain. Female Genitourinary Not Present- Frequency, Nocturia, Painful Urination, Pelvic Pain and  Urgency. Musculoskeletal Not Present- Myalgia. Neurological Not Present- Decreased Memory, Fainting, Headaches, Numbness, Seizures, Tingling, Tremor, Trouble walking and Weakness. Psychiatric Not Present- Anxiety, Bipolar, Change in Sleep Pattern, Depression, Fearful and Frequent crying. Endocrine Not Present-  Cold Intolerance, Excessive Hunger, Hair Changes, Heat Intolerance, Hot flashes and New Diabetes. Hematology Not Present- Blood Thinners, Easy Bruising, Excessive bleeding, Gland problems, HIV and Persistent Infections. All other systems negative  Vitals (Jacqueline Haggett; 02/21/2018 1:33 PM) 02/21/2018 1:32 PM Weight: 172.4 lb Height: 64in Body Surface Area: 1.84 m Body Mass Index: 29.59 kg/m  Temp.: 97.2F(Temporal)  Pulse: 103 (Regular)  BP: 150/96 (Sitting, Right Arm, Standard)       Physical Exam (Shawntel Farnworth MD; 02/21/2018 1:45 PM) The physical exam findings are as follows: Note:Constitutional: No acute distress, conversant, appears stated age  Eyes: Anicteric sclerae, moist conjunctiva, no lid lag  Neck: No thyromegaly, trachea midline, no cervical lymphadenopathy  Lungs: Clear to auscultation biilaterally, normal respiratory effot  Cardiovascular: regular rate & rhythm, no murmurs, no peripheal edema, pedal pulses 2+  GI: Soft, no masses or hepatosplenomegaly, non-tender to palpation  MSK: Normal gait, no clubbing cyanosis, edema  Skin: No rashes, palpation reveals normal skin turgor  Psychiatric: Appropriate judgment and insight, oriented to person, place, and time    Assessment & Plan (Deonta Bomberger MD; 02/21/2018 1:45 PM) SYMPTOMATIC CHOLELITHIASIS (K80.20) Impression: 67-year-old female with hypertension, hyperlipidemia, GERD, with symptomatically cholelithiasis  1. We will proceed to the operating room for a laparoscopic cholecystectomy  2. Risks and benefits were discussed with the patient to generally include, but not limited  to: infection, bleeding, possible need for post op ERCP, damage to the bile ducts, bile leak, and possible need for further surgery. Alternatives were offered and described. All questions were answered and the patient voiced understanding of the procedure and wishes to proceed at this point with a laparoscopic cholecystectomy 

## 2018-02-21 NOTE — H&P (Signed)
History of Present Illness Axel Filler(Zikeria Keough MD; 02/21/2018 1:45 PM) The patient is a 67 year old female who presents for evaluation of gall stones. Referred by: Dr. Azalia BilisKevin campos Chief Complaint: Abdominal pain  Patient is a 67 year old female with a history of hypertension, reflux, who comes in with a 2 to three-week history of abdominal pain. Patient states this or recurrence. Patient states she's never had previous occurrences. Patient does state that the pain began in the daytime. She states this lasted for 20 for 48 hours. This was followed by some soreness. She states that she had some nausea, diarrhea. She presented to the ER secondary generalized abdominal pain. Patient workup which didn't reveal ultrasound as well as CT scans. Reviewed these personally. This didn't reveal gallstones however no signs of acute cholecystitis. Patient's laboratory values were within normal limits.     Past Surgical History Adela Lank(Jacqueline Haggett; 02/21/2018 1:29 PM) Cesarean Section - Multiple  Colon Polyp Removal - Colonoscopy   Diagnostic Studies History Adela Lank(Jacqueline Haggett; 02/21/2018 1:29 PM) Colonoscopy  5-10 years ago Mammogram  1-3 years ago Pap Smear  1-5 years ago  Allergies Adela Lank(Jacqueline Haggett; 02/21/2018 1:30 PM) No Known Drug Allergies [02/21/2018]: Allergies Reconciled   Medication History (Jacqueline Haggett; 02/21/2018 1:32 PM) Losartan Potassium (50MG  Tablet, Oral) Active. Losartan Potassium-HCTZ (100-25MG  Tablet, Oral) Active. Lovastatin (20MG  Tablet, Oral) Active. Ondansetron HCl (4MG  Tablet, Oral) Active. Tylenol (Oral) Specific strength unknown - Active. Cetirizine HCl (10MG  Tablet, Oral) Active. Aspirin (81MG  Tablet DR, Oral) Active. Fish Oil (Oral) Specific strength unknown - Active. Medications Reconciled Calcium (Oral) Specific strength unknown - Active. Ventolin HFA (108 (90 Base)MCG/ACT Aerosol Soln, Inhalation) Active.  Social History  Adela Lank(Jacqueline Haggett; 02/21/2018 1:29 PM) Caffeine use  Carbonated beverages, Tea. No alcohol use  No drug use  Tobacco use  Never smoker.  Family History Adela Lank(Jacqueline Haggett; 02/21/2018 1:29 PM) Alcohol Abuse  Father. Arthritis  Sister. Breast Cancer  Mother. Diabetes Mellitus  Brother, Mother, Sister. Heart Disease  Mother, Sister.  Pregnancy / Birth History Adela Lank(Jacqueline Haggett; 02/21/2018 1:29 PM) Age at menarche  13 years. Age of menopause  6651-55 Gravida  5 Irregular periods  Maternal age  68-20 Para  6  Other Problems Adela Lank(Jacqueline Haggett; 02/21/2018 1:29 PM) Arthritis  Asthma  Cholelithiasis  Gastroesophageal Reflux Disease  High blood pressure  Hypercholesterolemia     Review of Systems Axel Filler(Shanteria Laye MD; 02/21/2018 1:43 PM) General Not Present- Appetite Loss, Chills, Fatigue, Fever, Night Sweats, Weight Gain and Weight Loss. Skin Not Present- Change in Wart/Mole, Dryness, Hives, Jaundice, New Lesions, Non-Healing Wounds, Rash and Ulcer. HEENT Present- Seasonal Allergies and Wears glasses/contact lenses. Not Present- Earache, Hearing Loss, Hoarseness, Nose Bleed, Oral Ulcers, Ringing in the Ears, Sinus Pain, Sore Throat, Visual Disturbances and Yellow Eyes. Respiratory Not Present- Bloody sputum, Chronic Cough, Difficulty Breathing, Snoring and Wheezing. Breast Not Present- Breast Mass, Breast Pain, Nipple Discharge and Skin Changes. Cardiovascular Not Present- Chest Pain, Difficulty Breathing Lying Down, Leg Cramps, Palpitations, Rapid Heart Rate, Shortness of Breath and Swelling of Extremities. Gastrointestinal Present- Indigestion and Vomiting. Not Present- Abdominal Pain, Bloating, Bloody Stool, Change in Bowel Habits, Chronic diarrhea, Constipation, Difficulty Swallowing, Excessive gas, Gets full quickly at meals, Hemorrhoids, Nausea and Rectal Pain. Female Genitourinary Not Present- Frequency, Nocturia, Painful Urination, Pelvic Pain and  Urgency. Musculoskeletal Not Present- Myalgia. Neurological Not Present- Decreased Memory, Fainting, Headaches, Numbness, Seizures, Tingling, Tremor, Trouble walking and Weakness. Psychiatric Not Present- Anxiety, Bipolar, Change in Sleep Pattern, Depression, Fearful and Frequent crying. Endocrine Not Present-  Cold Intolerance, Excessive Hunger, Hair Changes, Heat Intolerance, Hot flashes and New Diabetes. Hematology Not Present- Blood Thinners, Easy Bruising, Excessive bleeding, Gland problems, HIV and Persistent Infections. All other systems negative  Vitals Adela Lank(Jacqueline Haggett; 02/21/2018 1:33 PM) 02/21/2018 1:32 PM Weight: 172.4 lb Height: 64in Body Surface Area: 1.84 m Body Mass Index: 29.59 kg/m  Temp.: 97.6F(Temporal)  Pulse: 103 (Regular)  BP: 150/96 (Sitting, Right Arm, Standard)       Physical Exam Axel Filler(Cythina Mickelsen MD; 02/21/2018 1:45 PM) The physical exam findings are as follows: Note:Constitutional: No acute distress, conversant, appears stated age  Eyes: Anicteric sclerae, moist conjunctiva, no lid lag  Neck: No thyromegaly, trachea midline, no cervical lymphadenopathy  Lungs: Clear to auscultation biilaterally, normal respiratory effot  Cardiovascular: regular rate & rhythm, no murmurs, no peripheal edema, pedal pulses 2+  GI: Soft, no masses or hepatosplenomegaly, non-tender to palpation  MSK: Normal gait, no clubbing cyanosis, edema  Skin: No rashes, palpation reveals normal skin turgor  Psychiatric: Appropriate judgment and insight, oriented to person, place, and time    Assessment & Plan Axel Filler(Zury Fazzino MD; 02/21/2018 1:45 PM) SYMPTOMATIC CHOLELITHIASIS (K80.20) Impression: 67 year old female with hypertension, hyperlipidemia, GERD, with symptomatically cholelithiasis  1. We will proceed to the operating room for a laparoscopic cholecystectomy  2. Risks and benefits were discussed with the patient to generally include, but not limited  to: infection, bleeding, possible need for post op ERCP, damage to the bile ducts, bile leak, and possible need for further surgery. Alternatives were offered and described. All questions were answered and the patient voiced understanding of the procedure and wishes to proceed at this point with a laparoscopic cholecystectomy

## 2018-02-23 DIAGNOSIS — E7849 Other hyperlipidemia: Secondary | ICD-10-CM | POA: Diagnosis not present

## 2018-02-23 DIAGNOSIS — I10 Essential (primary) hypertension: Secondary | ICD-10-CM | POA: Diagnosis not present

## 2018-02-28 NOTE — Pre-Procedure Instructions (Signed)
Roberta Campos  02/28/2018      [facility] [address] - [place], [place] - [address] [place] [address] Phone: [phone] Fax: [phone]    Your procedure is scheduled on   Monday 03/07/18  [facility] Admitting at 1000 A.M.  Call this number if you have problems the morning of surgery:  [phone]   Remember:  Do not eat or drink after midnight.     Take these medicines the morning of surgery with A SIP OF WATER - ALBUTEROL IF NEEDED (BRING WITH YOU), EYE DROPS  7 days prior to surgery STOP taking any Aspirin(unless otherwise instructed by your surgeon), Aleve, Naproxen, Ibuprofen, Motrin, Advil, Goody's, BC's, all herbal medications, fish oil, and all vitamins    Do not wear jewelry, make-up or nail polish.  Do not wear lotions, powders, or perfumes, or deodorant.  Do not shave 48 hours prior to surgery.  Men may shave face and neck.  Do not bring valuables to the hospital.  [facility] is not responsible for any belongings or valuables.  Contacts, dentures or bridgework may not be worn into surgery.  Leave your suitcase in the car.  After surgery it may be brought to your room.  For patients admitted to the hospital, discharge time will be determined by your treatment team.  Patients discharged the day of surgery will not be allowed to drive home.   Name and phone number of your driver:    Special instructions:  [facility] - Preparing for Surgery  Before surgery, you can play an important role.  Because skin is not sterile, your skin needs to be as free of germs as possible.  You can reduce the number of germs on you skin by washing with CHG (chlorahexidine gluconate) soap before surgery.  CHG is an antiseptic cleaner which kills germs and bonds with the skin to continue killing germs even after washing.  Oral Hygiene is also important in reducing the risk of infection.  Remember to brush your teeth with your regular toothpaste the  morning of surgery.  Please DO NOT use if you have an allergy to CHG or antibacterial soaps.  If your skin becomes reddened/irritated stop using the CHG and inform your nurse when you arrive at Short Stay.  Do not shave (including legs and underarms) for at least 48 hours prior to the first CHG shower.  You may shave your face.  Please follow these instructions carefully:   1.  Shower with CHG Soap the night before surgery and the morning of Surgery.  2.  If you choose to wash your hair, wash your hair first as usual with your normal shampoo.  3.  After you shampoo, rinse your hair and body thoroughly to remove the shampoo. 4.  Use CHG as you would any other liquid soap.  You can apply chg directly to the skin and wash gently with a      scrungie or washcloth.           5.  Apply the CHG Soap to your body ONLY FROM THE NECK DOWN.   Do not use on open wounds or open sores. Avoid contact with your eyes, ears, mouth and genitals (private parts).  Wash genitals (private parts) with your normal soap.  6.  Wash thoroughly, paying special attention to the area where your surgery will be performed.  7.  Thoroughly rinse your body with warm water from  the neck down.  8.  DO NOT shower/wash with your normal soap after using and rinsing off the CHG Soap.  9.  Pat yourself dry with a clean towel.            10.  Wear clean pajamas.            11.  Place clean sheets on your bed the night of your first shower and do not sleep with pets.  Day of Surgery  Do not apply any lotions/deoderants the morning of surgery.   Please wear clean clothes to the hospital/surgery center. Remember to brush your teeth with toothpaste.     Please read over the following fact sheets that you were given. Pain Booklet

## 2018-03-01 ENCOUNTER — Encounter (HOSPITAL_COMMUNITY)
Admission: RE | Admit: 2018-03-01 | Discharge: 2018-03-01 | Disposition: A | Discharge status: Home / Self Care | Payer: Medicare HMO | Source: Ambulatory Visit | Attending: General Surgery | Admitting: General Surgery

## 2018-03-01 ENCOUNTER — Encounter (HOSPITAL_COMMUNITY): Payer: Self-pay | Admitting: *Deleted

## 2018-03-01 ENCOUNTER — Inpatient Hospital Stay (HOSPITAL_COMMUNITY): Admission: RE | Admit: 2018-03-01 | Payer: Medicare HMO | Source: Ambulatory Visit

## 2018-03-01 ENCOUNTER — Other Ambulatory Visit: Payer: Self-pay

## 2018-03-01 DIAGNOSIS — K8080 Other cholelithiasis without obstruction: Secondary | ICD-10-CM | POA: Diagnosis not present

## 2018-03-01 DIAGNOSIS — Z01818 Encounter for other preprocedural examination: Secondary | ICD-10-CM | POA: Insufficient documentation

## 2018-03-01 HISTORY — DX: Unspecified osteoarthritis, unspecified site: M19.90

## 2018-03-01 HISTORY — DX: Unspecified asthma, uncomplicated: J45.909

## 2018-03-01 HISTORY — DX: Dyspnea, unspecified: R06.00

## 2018-03-01 LAB — BASIC METABOLIC PANEL
ANION GAP: 8 (ref 5–15)
BUN: 8 mg/dL (ref 8–23)
CHLORIDE: 104 mmol/L (ref 98–111)
CO2: 28 mmol/L (ref 22–32)
Calcium: 9.6 mg/dL (ref 8.9–10.3)
Creatinine, Ser: 0.66 mg/dL (ref 0.44–1.00)
GFR calc non Af Amer: >60 mL/min (ref >60)
Glucose, Bld: 99 mg/dL (ref 70–99)
POTASSIUM: 3.4 mmol/L — AB (ref 3.5–5.1)
Sodium: 140 mmol/L (ref 135–145)

## 2018-03-01 LAB — CBC
HEMATOCRIT: 42.5 % (ref 36.0–46.0)
HEMOGLOBIN: 12.9 g/dL (ref 12.0–15.0)
MCH: 23.1 pg — ABNORMAL LOW (ref 26.0–34.0)
MCHC: 30.4 g/dL (ref 30.0–36.0)
MCV: 76 fL — ABNORMAL LOW (ref 78.0–100.0)
Platelets: 226 10*3/uL (ref 150–400)
RBC: 5.59 MIL/uL — ABNORMAL HIGH (ref 3.87–5.11)
RDW: 14.5 % (ref 11.5–15.5)
WBC: 6.1 10*3/uL (ref 4.0–10.5)

## 2018-03-07 ENCOUNTER — Other Ambulatory Visit: Payer: Self-pay

## 2018-03-07 ENCOUNTER — Encounter (HOSPITAL_COMMUNITY)
Admission: RE | Disposition: A | Discharge status: Home / Self Care | Payer: Self-pay | Source: Ambulatory Visit | Attending: General Surgery

## 2018-03-07 ENCOUNTER — Ambulatory Visit (HOSPITAL_COMMUNITY): Payer: Medicare HMO | Admitting: Certified Registered Nurse Anesthetist

## 2018-03-07 ENCOUNTER — Encounter (HOSPITAL_COMMUNITY): Payer: Self-pay | Admitting: Anesthesiology

## 2018-03-07 ENCOUNTER — Ambulatory Visit (HOSPITAL_COMMUNITY)
Admission: RE | Admit: 2018-03-07 | Discharge: 2018-03-07 | Disposition: A | Discharge status: Home / Self Care | Payer: Medicare HMO | Source: Ambulatory Visit | Attending: General Surgery | Admitting: General Surgery

## 2018-03-07 DIAGNOSIS — Z811 Family history of alcohol abuse and dependence: Secondary | ICD-10-CM | POA: Diagnosis not present

## 2018-03-07 DIAGNOSIS — Z8261 Family history of arthritis: Secondary | ICD-10-CM | POA: Insufficient documentation

## 2018-03-07 DIAGNOSIS — J45909 Unspecified asthma, uncomplicated: Secondary | ICD-10-CM | POA: Insufficient documentation

## 2018-03-07 DIAGNOSIS — E7800 Pure hypercholesterolemia, unspecified: Secondary | ICD-10-CM | POA: Insufficient documentation

## 2018-03-07 DIAGNOSIS — Z79899 Other long term (current) drug therapy: Secondary | ICD-10-CM | POA: Diagnosis not present

## 2018-03-07 DIAGNOSIS — Z833 Family history of diabetes mellitus: Secondary | ICD-10-CM | POA: Insufficient documentation

## 2018-03-07 DIAGNOSIS — K8010 Calculus of gallbladder with chronic cholecystitis without obstruction: Secondary | ICD-10-CM | POA: Diagnosis not present

## 2018-03-07 DIAGNOSIS — Z7951 Long term (current) use of inhaled steroids: Secondary | ICD-10-CM | POA: Insufficient documentation

## 2018-03-07 DIAGNOSIS — Z803 Family history of malignant neoplasm of breast: Secondary | ICD-10-CM | POA: Insufficient documentation

## 2018-03-07 DIAGNOSIS — Z86010 Personal history of colonic polyps: Secondary | ICD-10-CM | POA: Insufficient documentation

## 2018-03-07 DIAGNOSIS — Z7982 Long term (current) use of aspirin: Secondary | ICD-10-CM | POA: Diagnosis not present

## 2018-03-07 DIAGNOSIS — I10 Essential (primary) hypertension: Secondary | ICD-10-CM | POA: Diagnosis not present

## 2018-03-07 DIAGNOSIS — Z8249 Family history of ischemic heart disease and other diseases of the circulatory system: Secondary | ICD-10-CM | POA: Insufficient documentation

## 2018-03-07 DIAGNOSIS — M1990 Unspecified osteoarthritis, unspecified site: Secondary | ICD-10-CM | POA: Insufficient documentation

## 2018-03-07 DIAGNOSIS — K219 Gastro-esophageal reflux disease without esophagitis: Secondary | ICD-10-CM | POA: Insufficient documentation

## 2018-03-07 DIAGNOSIS — K8020 Calculus of gallbladder without cholecystitis without obstruction: Secondary | ICD-10-CM | POA: Diagnosis not present

## 2018-03-07 HISTORY — PX: CHOLECYSTECTOMY: SHX55

## 2018-03-07 SURGERY — LAPAROSCOPIC CHOLECYSTECTOMY
Anesthesia: General | Site: Abdomen

## 2018-03-07 MED ORDER — HYDROMORPHONE HCL 1 MG/ML IJ SOLN
0.2500 mg | INTRAMUSCULAR | Status: DC | PRN
  Administered 2018-03-07 (×2): 0.25 mg via INTRAVENOUS
  Administered 2018-03-07: 0.5 mg via INTRAVENOUS

## 2018-03-07 MED ORDER — PROPOFOL 10 MG/ML IV BOLUS
INTRAVENOUS | Status: DC | PRN
  Administered 2018-03-07: 140 mg via INTRAVENOUS

## 2018-03-07 MED ORDER — OXYCODONE HCL 5 MG/5ML PO SOLN: 5.0000 mg | Freq: Once | ORAL | Status: AC | PRN

## 2018-03-07 MED ORDER — SUCCINYLCHOLINE CHLORIDE 200 MG/10ML IV SOSY
PREFILLED_SYRINGE | INTRAVENOUS | Status: DC | PRN
  Administered 2018-03-07: 120 mg via INTRAVENOUS

## 2018-03-07 MED ORDER — SUCCINYLCHOLINE CHLORIDE 200 MG/10ML IV SOSY
PREFILLED_SYRINGE | INTRAVENOUS | Status: AC
  Filled 2018-03-07: qty 10

## 2018-03-07 MED ORDER — 0.9 % SODIUM CHLORIDE (POUR BTL) OPTIME
TOPICAL | Status: DC | PRN
  Administered 2018-03-07: 1000 mL

## 2018-03-07 MED ORDER — TRAMADOL HCL 50 MG PO TABS: 50.0000 mg | ORAL_TABLET | Freq: Four times a day (QID) | ORAL | 0 refills | Status: AC | PRN

## 2018-03-07 MED ORDER — ROCURONIUM BROMIDE 50 MG/5ML IV SOSY
PREFILLED_SYRINGE | INTRAVENOUS | Status: AC
  Filled 2018-03-07: qty 5

## 2018-03-07 MED ORDER — ROCURONIUM BROMIDE 10 MG/ML (PF) SYRINGE
PREFILLED_SYRINGE | INTRAVENOUS | Status: DC | PRN
  Administered 2018-03-07: 40 mg via INTRAVENOUS

## 2018-03-07 MED ORDER — SODIUM CHLORIDE 0.9 % IR SOLN
Status: DC | PRN
  Administered 2018-03-07: 1000 mL

## 2018-03-07 MED ORDER — LACTATED RINGERS IV SOLN: INTRAVENOUS | Status: DC

## 2018-03-07 MED ORDER — HYDROMORPHONE HCL 1 MG/ML IJ SOLN
INTRAMUSCULAR | Status: AC
  Filled 2018-03-07: qty 1

## 2018-03-07 MED ORDER — GABAPENTIN 300 MG PO CAPS
300.0000 mg | ORAL_CAPSULE | ORAL | Status: AC
  Administered 2018-03-07: 300 mg via ORAL
  Filled 2018-03-07: qty 1

## 2018-03-07 MED ORDER — PHENYLEPHRINE 40 MCG/ML (10ML) SYRINGE FOR IV PUSH (FOR BLOOD PRESSURE SUPPORT)
PREFILLED_SYRINGE | INTRAVENOUS | Status: AC
  Filled 2018-03-07: qty 10

## 2018-03-07 MED ORDER — PROMETHAZINE HCL 25 MG/ML IJ SOLN: 6.2500 mg | INTRAMUSCULAR | Status: DC | PRN

## 2018-03-07 MED ORDER — FENTANYL CITRATE (PF) 100 MCG/2ML IJ SOLN
INTRAMUSCULAR | Status: DC | PRN
  Administered 2018-03-07 (×2): 100 ug via INTRAVENOUS
  Administered 2018-03-07: 50 ug via INTRAVENOUS

## 2018-03-07 MED ORDER — MEPERIDINE HCL 50 MG/ML IJ SOLN: 6.2500 mg | INTRAMUSCULAR | Status: DC | PRN

## 2018-03-07 MED ORDER — OXYCODONE HCL 5 MG PO TABS
ORAL_TABLET | ORAL | Status: AC
  Filled 2018-03-07: qty 1

## 2018-03-07 MED ORDER — BUPIVACAINE HCL 0.25 % IJ SOLN
INTRAMUSCULAR | Status: DC | PRN
  Administered 2018-03-07: 30 mL

## 2018-03-07 MED ORDER — ACETAMINOPHEN 500 MG PO TABS
1000.0000 mg | ORAL_TABLET | ORAL | Status: AC
  Administered 2018-03-07: 1000 mg via ORAL
  Filled 2018-03-07: qty 2

## 2018-03-07 MED ORDER — OXYCODONE HCL 5 MG PO TABS
5.0000 mg | ORAL_TABLET | Freq: Once | ORAL | Status: AC | PRN
  Administered 2018-03-07: 5 mg via ORAL

## 2018-03-07 MED ORDER — CHLORHEXIDINE GLUCONATE CLOTH 2 % EX PADS: 6.0000 | MEDICATED_PAD | Freq: Once | CUTANEOUS | Status: DC

## 2018-03-07 MED ORDER — CELECOXIB 200 MG PO CAPS
200.0000 mg | ORAL_CAPSULE | ORAL | Status: AC
  Administered 2018-03-07: 200 mg via ORAL
  Filled 2018-03-07: qty 1

## 2018-03-07 MED ORDER — LIDOCAINE 2% (20 MG/ML) 5 ML SYRINGE
INTRAMUSCULAR | Status: DC | PRN
  Administered 2018-03-07: 60 mg via INTRAVENOUS

## 2018-03-07 MED ORDER — BUPIVACAINE HCL (PF) 0.25 % IJ SOLN
INTRAMUSCULAR | Status: AC
  Filled 2018-03-07: qty 30

## 2018-03-07 MED ORDER — SUGAMMADEX SODIUM 200 MG/2ML IV SOLN
INTRAVENOUS | Status: DC | PRN
  Administered 2018-03-07: 160 mg via INTRAVENOUS

## 2018-03-07 MED ORDER — CEFAZOLIN SODIUM-DEXTROSE 2-4 GM/100ML-% IV SOLN
2.0000 g | INTRAVENOUS | Status: AC
  Administered 2018-03-07: 2 g via INTRAVENOUS
  Filled 2018-03-07: qty 100

## 2018-03-07 MED ORDER — MIDAZOLAM HCL 2 MG/2ML IJ SOLN
INTRAMUSCULAR | Status: DC | PRN
  Administered 2018-03-07: 2 mg via INTRAVENOUS

## 2018-03-07 MED ORDER — ONDANSETRON HCL 4 MG/2ML IJ SOLN
INTRAMUSCULAR | Status: AC
  Filled 2018-03-07: qty 4

## 2018-03-07 MED ORDER — ONDANSETRON HCL 4 MG/2ML IJ SOLN
INTRAMUSCULAR | Status: DC | PRN
  Administered 2018-03-07: 4 mg via INTRAVENOUS

## 2018-03-07 MED ORDER — FENTANYL CITRATE (PF) 250 MCG/5ML IJ SOLN
INTRAMUSCULAR | Status: AC
  Filled 2018-03-07: qty 5

## 2018-03-07 MED ORDER — LACTATED RINGERS IV SOLN
INTRAVENOUS | Status: DC
  Administered 2018-03-07: 11:00:00 via INTRAVENOUS

## 2018-03-07 MED ORDER — DEXAMETHASONE SODIUM PHOSPHATE 10 MG/ML IJ SOLN
INTRAMUSCULAR | Status: DC | PRN
  Administered 2018-03-07: 10 mg via INTRAVENOUS

## 2018-03-07 MED ORDER — MIDAZOLAM HCL 2 MG/2ML IJ SOLN
INTRAMUSCULAR | Status: AC
  Filled 2018-03-07: qty 2

## 2018-03-07 SURGICAL SUPPLY — 40 items
CANISTER SUCT 3000ML PPV (MISCELLANEOUS) ×3 IMPLANT
CHLORAPREP W/TINT 26ML (MISCELLANEOUS) ×3 IMPLANT
CLIP VESOLOCK MED LG 6/CT (CLIP) ×6 IMPLANT
COVER SURGICAL LIGHT HANDLE (MISCELLANEOUS) ×3 IMPLANT
COVER TRANSDUCER ULTRASND (DRAPES) ×3 IMPLANT
DEFOGGER SCOPE WARMER CLEARIFY (MISCELLANEOUS) IMPLANT
DERMABOND ADVANCED (GAUZE/BANDAGES/DRESSINGS) ×2
DERMABOND ADVANCED .7 DNX12 (GAUZE/BANDAGES/DRESSINGS) ×1 IMPLANT
ELECT REM PT RETURN 9FT ADLT (ELECTROSURGICAL) ×3
ELECTRODE REM PT RTRN 9FT ADLT (ELECTROSURGICAL) ×1 IMPLANT
GLOVE BIO SURGEON STRL SZ7.5 (GLOVE) ×6 IMPLANT
GLOVE BIO SURGEON STRL SZ8 (GLOVE) ×3 IMPLANT
GLOVE BIOGEL PI IND STRL 8 (GLOVE) ×1 IMPLANT
GLOVE BIOGEL PI INDICATOR 8 (GLOVE) ×2
GLOVE INDICATOR 7.5 STRL GRN (GLOVE) ×3 IMPLANT
GOWN STRL REUS W/ TWL LRG LVL3 (GOWN DISPOSABLE) ×1 IMPLANT
GOWN STRL REUS W/ TWL XL LVL3 (GOWN DISPOSABLE) ×2 IMPLANT
GOWN STRL REUS W/TWL LRG LVL3 (GOWN DISPOSABLE) ×2
GOWN STRL REUS W/TWL XL LVL3 (GOWN DISPOSABLE) ×4
GRASPER SUT TROCAR 14GX15 (MISCELLANEOUS) ×3 IMPLANT
KIT BASIN OR (CUSTOM PROCEDURE TRAY) ×3 IMPLANT
KIT TURNOVER KIT B (KITS) ×3 IMPLANT
NEEDLE INSUFFLATION 14GA 120MM (NEEDLE) ×3 IMPLANT
NS IRRIG 1000ML POUR BTL (IV SOLUTION) ×3 IMPLANT
PAD ARMBOARD 7.5X6 YLW CONV (MISCELLANEOUS) ×6 IMPLANT
POUCH LAPAROSCOPIC INSTRUMENT (MISCELLANEOUS) ×3 IMPLANT
POUCH RETRIEVAL ECOSAC 10 (ENDOMECHANICALS) IMPLANT
POUCH RETRIEVAL ECOSAC 10MM (ENDOMECHANICALS)
SCISSORS LAP 5X35 DISP (ENDOMECHANICALS) ×3 IMPLANT
SET IRRIG TUBING LAPAROSCOPIC (IRRIGATION / IRRIGATOR) ×3 IMPLANT
SLEEVE ENDOPATH XCEL 5M (ENDOMECHANICALS) ×3 IMPLANT
SPECIMEN JAR SMALL (MISCELLANEOUS) ×3 IMPLANT
SUT MNCRL AB 4-0 PS2 18 (SUTURE) ×3 IMPLANT
TOWEL OR 17X24 6PK STRL BLUE (TOWEL DISPOSABLE) ×3 IMPLANT
TOWEL OR 17X26 10 PK STRL BLUE (TOWEL DISPOSABLE) IMPLANT
TRAY LAPAROSCOPIC MC (CUSTOM PROCEDURE TRAY) ×3 IMPLANT
TROCAR XCEL NON-BLD 11X100MML (ENDOMECHANICALS) ×3 IMPLANT
TROCAR XCEL NON-BLD 5MMX100MML (ENDOMECHANICALS) ×3 IMPLANT
TUBING INSUFFLATION (TUBING) ×3 IMPLANT
WATER STERILE IRR 1000ML POUR (IV SOLUTION) ×3 IMPLANT

## 2018-03-07 NOTE — Anesthesia Preprocedure Evaluation (Addendum)
Anesthesia Evaluation  Patient identified by MRN, date of birth, ID band Patient awake    Reviewed: Allergy & Precautions, NPO status , Patient's Chart, lab work & pertinent test results  Airway Mallampati: II  TM Distance: >3 FB Neck ROM: Full    Dental  (+) Missing, Dental Advisory Given, Poor Dentition,    Pulmonary asthma ,    breath sounds clear to auscultation       Cardiovascular hypertension, Pt. on medications  Rhythm:Regular Rate:Normal     Neuro/Psych negative neurological ROS  negative psych ROS   GI/Hepatic Neg liver ROS, GERD  ,  Endo/Other  negative endocrine ROS  Renal/GU negative Renal ROS     Musculoskeletal  (+) Arthritis ,   Abdominal Normal abdominal exam  (+)   Peds  Hematology negative hematology ROS (+)   Anesthesia Other Findings   Reproductive/Obstetrics                            Lab Results  Component Value Date   WBC 6.1 03/01/2018   HGB 12.9 03/01/2018   HCT 42.5 03/01/2018   MCV 76.0 (L) 03/01/2018   PLT 226 03/01/2018   Lab Results  Component Value Date   CREATININE 0.66 03/01/2018   BUN 8 03/01/2018   NA 140 03/01/2018   K 3.4 (L) 03/01/2018   CL 104 03/01/2018   CO2 28 03/01/2018   No results found for: INR, PROTIME  EKG: normal sinus rhythm  Anesthesia Physical Anesthesia Plan  ASA: II  Anesthesia Plan: General   Post-op Pain Management:    Induction: Intravenous  PONV Risk Score and Plan: 4 or greater and Ondansetron, Dexamethasone, Midazolam and Treatment may vary due to age or medical condition  Airway Management Planned: Oral ETT  Additional Equipment: None  Intra-op Plan:   Post-operative Plan: Extubation in OR  Informed Consent: I have reviewed the patients History and Physical, chart, labs and discussed the procedure including the risks, benefits and alternatives for the proposed anesthesia with the patient or  authorized representative who has indicated his/her understanding and acceptance.   Dental advisory given  Plan Discussed with: CRNA  Anesthesia Plan Comments:        Anesthesia Quick Evaluation

## 2018-03-07 NOTE — Anesthesia Postprocedure Evaluation (Signed)
Anesthesia Post Note  Patient: Roberta Campos  Procedure(s) Performed: LAPAROSCOPIC CHOLECYSTECTOMY (N/A Abdomen)     Patient location during evaluation: PACU Anesthesia Type: General Level of consciousness: awake and alert Pain management: pain level controlled Vital Signs Assessment: post-procedure vital signs reviewed and stable Respiratory status: spontaneous breathing, nonlabored ventilation, respiratory function stable and patient connected to nasal cannula oxygen Cardiovascular status: blood pressure returned to baseline and stable Postop Assessment: no apparent nausea or vomiting Anesthetic complications: no    Last Vitals:  Vitals:   03/07/18 1406 03/07/18 1420  BP: (!) 133/91 134/78  Pulse: 75 70  Resp: 16 17  Temp: (!) 36.3 C   SpO2: 95% 95%    Last Pain:  Vitals:   03/07/18 1420  TempSrc:   PainSc: 3                  Shelton SilvasKevin D Innocence Schlotzhauer

## 2018-03-07 NOTE — Anesthesia Procedure Notes (Signed)
Procedure Name: Intubation Date/Time: 03/07/2018 12:09 PM Performed by: Leonor Liv, CRNA Pre-anesthesia Checklist: Patient identified, Emergency Drugs available, Suction available and Patient being monitored Patient Re-evaluated:Patient Re-evaluated prior to induction Oxygen Delivery Method: Circle System Utilized Preoxygenation: Pre-oxygenation with 100% oxygen Induction Type: Rapid sequence and Cricoid Pressure applied Laryngoscope Size: Mac and 3 Grade View: Grade I Tube type: Oral Tube size: 7.0 mm Number of attempts: 1 Airway Equipment and Method: Stylet and Oral airway Placement Confirmation: ETT inserted through vocal cords under direct vision,  positive ETCO2 and breath sounds checked- equal and bilateral Secured at: 20 cm Tube secured with: Tape Dental Injury: Teeth and Oropharynx as per pre-operative assessment

## 2018-03-07 NOTE — Interval H&P Note (Signed)
History and Physical Interval Note:  03/07/2018 11:49 AM  Roberta Campos  has presented today for surgery, with the diagnosis of gallstones  The various methods of treatment have been discussed with the patient and family. After consideration of risks, benefits and other options for treatment, the patient has consented to  Procedure(s): LAPAROSCOPIC CHOLECYSTECTOMY (N/A) as a surgical intervention .  The patient's history has been reviewed, patient examined, no change in status, stable for surgery.  I have reviewed the patient's chart and labs.  Questions were answered to the patient's satisfaction.     Marigene Ehlersamirez Jr., Jed LimerickArmando

## 2018-03-07 NOTE — Op Note (Signed)
03/07/2018  12:47 PM  PATIENT:  Roberta Campos  67 y.o. female  PRE-OPERATIVE DIAGNOSIS:  gallstones  POST-OPERATIVE DIAGNOSIS: gallstones, chronic cholecystitis  PROCEDURE:  Procedure(s): LAPAROSCOPIC CHOLECYSTECTOMY (N/A)  SURGEON:  Surgeon(s) and Role:    Axel Filler* Treyvion Durkee, MD - Primary  ANESTHESIA:   local and general  EBL:  minimal   BLOOD ADMINISTERED:none  DRAINS: none   LOCAL MEDICATIONS USED:  BUPIVICAINE   SPECIMEN:  Source of Specimen:  gallbladder  DISPOSITION OF SPECIMEN:  PATHOLOGY  COUNTS:  YES  TOURNIQUET:  * No tourniquets in log *  DICTATION: .Dragon Dictation  EBL: <5cc   Complications: none   Counts: reported as correct x 2   Findings:chronic inflamation of gallbladder, gallstones  Indications for procedure: Pt is a 21F with RUQ pain and seen to have gallstones.   Details of the procedure: The patient was taken to the operating and placed in the supine position with bilateral SCDs in place. A time out was called and all facts were verified. A pneumoperitoneum was obtained via A Veress needle technique to a pressure of 14mm of mercury. A 5mm trochar was then placed in the right upper quadrant under visualization, and there were no injuries to any abdominal organs. A 11 mm port was then placed in the umbilical region after infiltrating with local anesthesia under direct visualization. A second epigastric port was placed under direct visualization.   The gallbladder was identified and retracted, the peritoneum was then sharply dissected from the gallbladder and this dissection was carried down to Calot's triangle. The cystic duct was identified and dissected circumferentially and seen going into the gallbladder 360.  The cystic artery was dissected away from the surrounding tissues.   The critical angle was obtained.    2 clips were placed proximally one distally and the cystic duct transected. The cystic artery was identified and 2 clips placed  proximally and one distally and transected. We then proceeded to remove the gallbladder off the hepatic fossa with Bovie cautery. A retrieval bag was then placed in the abdomen and gallbladder placed in the bag. The hepatic fossa was then reexamined and hemostasis was achieved with Bovie cautery and was excellent at this portion of the case. The subhepatic fossa and perihepatic fossa was then irrigated until the effluent was clear. The specimen bag and specimen were removed from the abdominal cavity.  The 11 mm trocar fascia was reapproximated with the Endo Close #1 Vicryl x2. The pneumoperitoneum was evacuated and all trochars removed under direct visulalization. The skin was then closed with 4-0 Monocryl and the skin dressed with Dermabond. The patient was awaken from general anesthesia and taken to the recovery room in stable condition.    PLAN OF CARE: Discharge to home after PACU  PATIENT DISPOSITION:  PACU - hemodynamically stable.   Delay start of Pharmacological VTE agent (>24hrs) due to surgical blood loss or risk of bleeding: not applicable

## 2018-03-07 NOTE — Discharge Instructions (Signed)
[facility] ______[facility] °LAPAROSCOPIC SURGERY: POST OP INSTRUCTIONS °Always review your discharge instruction sheet given to you by the facility where your surgery was performed. °IF YOU HAVE DISABILITY OR FAMILY LEAVE FORMS, YOU MUST BRING THEM TO THE OFFICE FOR PROCESSING.   °DO NOT GIVE THEM TO YOUR DOCTOR. ° °1. A prescription for pain medication may be given to you upon discharge.  Take your pain medication as prescribed, if needed.  If narcotic pain medicine is not needed, then you may take acetaminophen (Tylenol) or ibuprofen (Advil) as needed. °2. Take your usually prescribed medications unless otherwise directed. °3. If you need a refill on your pain medication, please contact your pharmacy.  They will contact our office to request authorization. Prescriptions will not be filled after 5pm or on week-ends. °4. You should follow a light diet the first few days after arrival home, such as soup and crackers, etc.  Be sure to include lots of fluids daily. °5. Most patients will experience some swelling and bruising in the area of the incisions.  Ice packs will help.  Swelling and bruising can take several days to resolve.  °6. It is common to experience some constipation if taking pain medication after surgery.  Increasing fluid intake and taking a stool softener (such as Colace) will usually help or prevent this problem from occurring.  A mild laxative (Milk of Magnesia or Miralax) should be taken according to package instructions if there are no bowel movements after 48 hours. °7. Unless discharge instructions indicate otherwise, you may remove your bandages 24-48 hours after surgery, and you may shower at that time.  You may have steri-strips (small skin tapes) in place directly over the incision.  These strips should be left on the skin for 7-10 days.  If your surgeon used skin glue on the incision, you may shower in 24 hours.  The glue will flake off over the next 2-3 weeks.  Any sutures or  staples will be removed at the office during your follow-up visit. °8. ACTIVITIES:  You may resume regular (light) daily activities beginning the next day--such as daily self-care, walking, climbing stairs--gradually increasing activities as tolerated.  You may have sexual intercourse when it is comfortable.  Refrain from any heavy lifting or straining until approved by your doctor. °a. You may drive when you are no longer taking prescription pain medication, you can comfortably wear a seatbelt, and you can safely maneuver your car and apply brakes. °b. RETURN TO WORK:  __________________________________________________________ °9. You should see your doctor in the office for a follow-up appointment approximately 2-3 weeks after your surgery.  Make sure that you call for this appointment within a day or two after you arrive home to insure a convenient appointment time. °10. OTHER INSTRUCTIONS: __________________________________________________________________________________________________________________________ __________________________________________________________________________________________________________________________ °WHEN TO CALL YOUR DOCTOR: °1. Fever over 101.0 °2. Inability to urinate °3. Continued bleeding from incision. °4. Increased pain, redness, or drainage from the incision. °5. Increasing abdominal pain ° °The clinic staff is available to answer your questions during regular business hours.  Please don’t hesitate to call and ask to speak to one of the nurses for clinical concerns.  If you have a medical emergency, go to the nearest emergency room or call 911.  A surgeon from [facility] is always on call at the hospital. °[address], [address], [place], [place]  [address] ? [address] [address], [place], [place]   [address] °[phone] ? [phone] ? FAX [phone] °Web site:   [email or web address] °

## 2018-03-07 NOTE — Transfer of Care (Signed)
Immediate Anesthesia Transfer of Care Note  Patient: Roberta Campos  Procedure(s) Performed: LAPAROSCOPIC CHOLECYSTECTOMY (N/A Abdomen)  Patient Location: PACU  Anesthesia Type:General  Level of Consciousness: drowsy  Airway & Oxygen Therapy: Patient Spontanous Breathing and Patient connected to nasal cannula oxygen  Post-op Assessment: Report given to RN, Post -op Vital signs reviewed and stable and Patient moving all extremities  Post vital signs: Reviewed and stable  Last Vitals:  Vitals Value Taken Time  BP 128/89 03/07/2018  1:04 PM  Temp    Pulse 43 03/07/2018  1:05 PM  Resp 14 03/07/2018  1:05 PM  SpO2 93 % 03/07/2018  1:05 PM  Vitals shown include unvalidated device data.  Last Pain:  Vitals:   03/07/18 1045  TempSrc:   PainSc: 5       Patients Stated Pain Goal: 3 (03/07/18 1045)  Complications: No apparent anesthesia complications

## 2018-03-08 ENCOUNTER — Encounter (HOSPITAL_COMMUNITY): Payer: Self-pay | Admitting: General Surgery

## 2018-03-24 DIAGNOSIS — M1712 Unilateral primary osteoarthritis, left knee: Secondary | ICD-10-CM | POA: Diagnosis not present

## 2018-03-25 DIAGNOSIS — E7849 Other hyperlipidemia: Secondary | ICD-10-CM | POA: Diagnosis not present

## 2018-03-25 DIAGNOSIS — I10 Essential (primary) hypertension: Secondary | ICD-10-CM | POA: Diagnosis not present

## 2018-04-19 DIAGNOSIS — H524 Presbyopia: Secondary | ICD-10-CM | POA: Diagnosis not present

## 2018-04-22 DIAGNOSIS — I10 Essential (primary) hypertension: Secondary | ICD-10-CM | POA: Diagnosis not present

## 2018-04-22 DIAGNOSIS — E7849 Other hyperlipidemia: Secondary | ICD-10-CM | POA: Diagnosis not present

## 2018-05-17 DIAGNOSIS — K219 Gastro-esophageal reflux disease without esophagitis: Secondary | ICD-10-CM | POA: Diagnosis not present

## 2018-05-17 DIAGNOSIS — Z9049 Acquired absence of other specified parts of digestive tract: Secondary | ICD-10-CM | POA: Diagnosis not present

## 2018-05-17 DIAGNOSIS — R0789 Other chest pain: Secondary | ICD-10-CM | POA: Diagnosis not present

## 2018-05-17 DIAGNOSIS — I10 Essential (primary) hypertension: Secondary | ICD-10-CM | POA: Diagnosis not present

## 2018-05-17 DIAGNOSIS — Z79899 Other long term (current) drug therapy: Secondary | ICD-10-CM | POA: Diagnosis not present

## 2018-05-17 DIAGNOSIS — Z7982 Long term (current) use of aspirin: Secondary | ICD-10-CM | POA: Diagnosis not present

## 2018-05-17 DIAGNOSIS — M1990 Unspecified osteoarthritis, unspecified site: Secondary | ICD-10-CM | POA: Diagnosis not present

## 2018-05-17 DIAGNOSIS — E7800 Pure hypercholesterolemia, unspecified: Secondary | ICD-10-CM | POA: Diagnosis not present

## 2018-05-17 DIAGNOSIS — K6389 Other specified diseases of intestine: Secondary | ICD-10-CM | POA: Diagnosis not present

## 2018-05-17 DIAGNOSIS — K529 Noninfective gastroenteritis and colitis, unspecified: Secondary | ICD-10-CM | POA: Diagnosis not present

## 2018-06-07 DIAGNOSIS — E7849 Other hyperlipidemia: Secondary | ICD-10-CM | POA: Diagnosis not present

## 2018-06-07 DIAGNOSIS — I10 Essential (primary) hypertension: Secondary | ICD-10-CM | POA: Diagnosis not present

## 2018-07-11 DIAGNOSIS — I10 Essential (primary) hypertension: Secondary | ICD-10-CM | POA: Diagnosis not present

## 2018-07-11 DIAGNOSIS — E7849 Other hyperlipidemia: Secondary | ICD-10-CM | POA: Diagnosis not present

## 2018-08-02 DIAGNOSIS — I10 Essential (primary) hypertension: Secondary | ICD-10-CM | POA: Diagnosis not present

## 2018-08-02 DIAGNOSIS — J4520 Mild intermittent asthma, uncomplicated: Secondary | ICD-10-CM | POA: Diagnosis not present

## 2018-08-02 DIAGNOSIS — Z6830 Body mass index (BMI) 30.0-30.9, adult: Secondary | ICD-10-CM | POA: Diagnosis not present

## 2018-08-02 DIAGNOSIS — E7849 Other hyperlipidemia: Secondary | ICD-10-CM | POA: Diagnosis not present

## 2018-08-09 DIAGNOSIS — E7849 Other hyperlipidemia: Secondary | ICD-10-CM | POA: Diagnosis not present

## 2018-08-09 DIAGNOSIS — I10 Essential (primary) hypertension: Secondary | ICD-10-CM | POA: Diagnosis not present

## 2018-08-12 DIAGNOSIS — I10 Essential (primary) hypertension: Secondary | ICD-10-CM | POA: Diagnosis not present

## 2018-08-12 DIAGNOSIS — Z6830 Body mass index (BMI) 30.0-30.9, adult: Secondary | ICD-10-CM | POA: Diagnosis not present

## 2018-08-12 DIAGNOSIS — J4520 Mild intermittent asthma, uncomplicated: Secondary | ICD-10-CM | POA: Diagnosis not present

## 2018-08-12 DIAGNOSIS — E7849 Other hyperlipidemia: Secondary | ICD-10-CM | POA: Diagnosis not present

## 2018-08-22 DIAGNOSIS — I10 Essential (primary) hypertension: Secondary | ICD-10-CM | POA: Diagnosis not present

## 2018-08-22 DIAGNOSIS — E7849 Other hyperlipidemia: Secondary | ICD-10-CM | POA: Diagnosis not present

## 2018-11-01 DIAGNOSIS — I10 Essential (primary) hypertension: Secondary | ICD-10-CM | POA: Diagnosis not present

## 2018-11-01 DIAGNOSIS — E7849 Other hyperlipidemia: Secondary | ICD-10-CM | POA: Diagnosis not present

## 2018-11-30 DIAGNOSIS — I10 Essential (primary) hypertension: Secondary | ICD-10-CM | POA: Diagnosis not present

## 2018-11-30 DIAGNOSIS — E7849 Other hyperlipidemia: Secondary | ICD-10-CM | POA: Diagnosis not present

## 2018-12-16 ENCOUNTER — Other Ambulatory Visit: Payer: Self-pay

## 2018-12-16 NOTE — Patient Outreach (Signed)
[facility]) Care Management  12/16/2018  TICIA STEPTOE [birth year] [id number]   Medication Adherence call to Mrs. Youlanda Guthmiller HIPPA Compliant Voice message left with a call back number. Mrs. Guenthner is showing past due on Losartan/Hctz 100/25 and Lovastatin 20 mg under [employer].   Lillia Abed CPhT Pharmacy Technician [facility] Care Management Direct Dial [phone]  Fax [phone] [email or web address]

## 2019-01-01 IMAGING — US US ABDOMEN LIMITED
1 series · 14 of 25 positions shown · non-contrast
Comparison: Sonography at [HOSPITAL] [facility] 02/02/2018.

CLINICAL DATA: RIGHT upper quadrant pain.

EXAM:
ULTRASOUND ABDOMEN LIMITED RIGHT UPPER QUADRANT

[Series 1: us abdomen limited · 0.20mm/px · 14 of 46 slices shown]
[im 1/46]
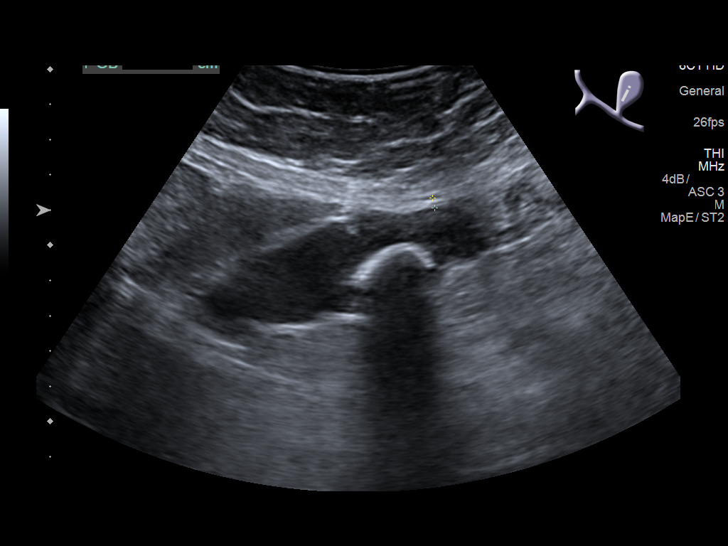
[im 4/46]
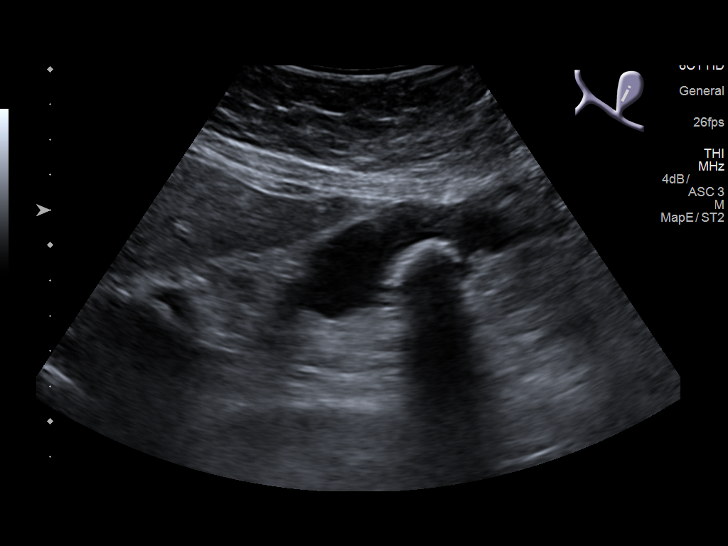
[im 8/46]
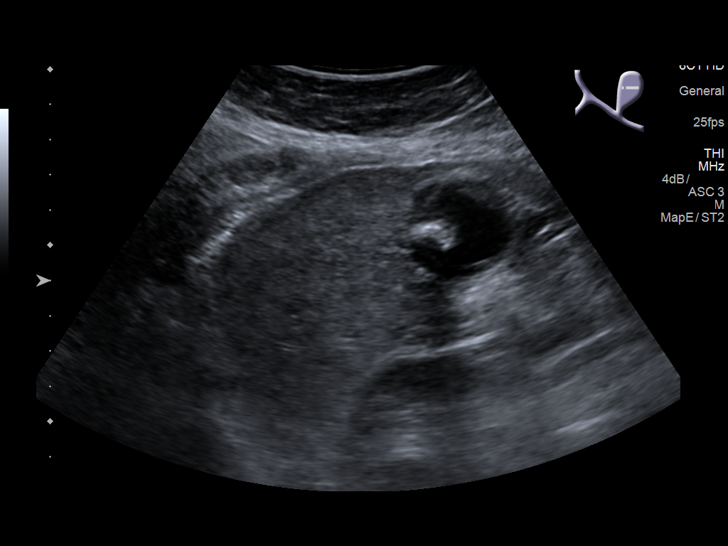
[im 12/46]
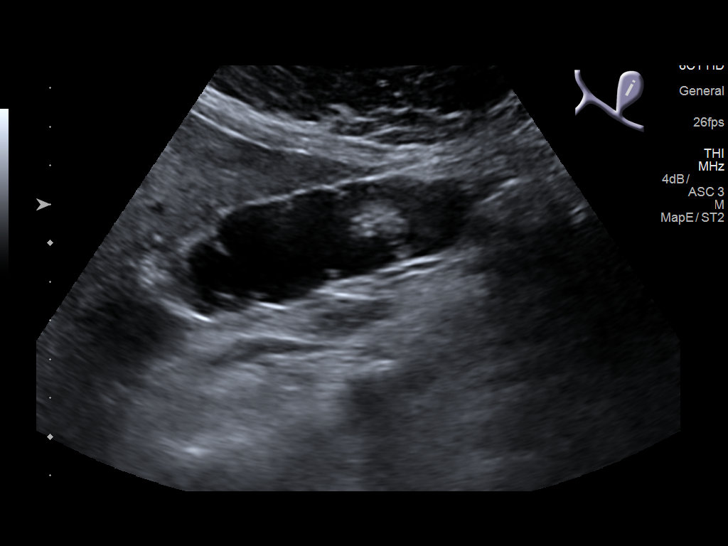
[im 16/46]
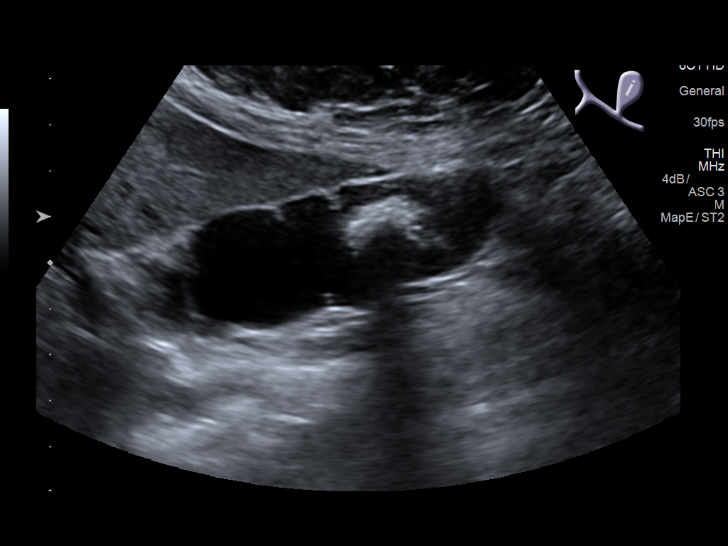
[im 17/46]
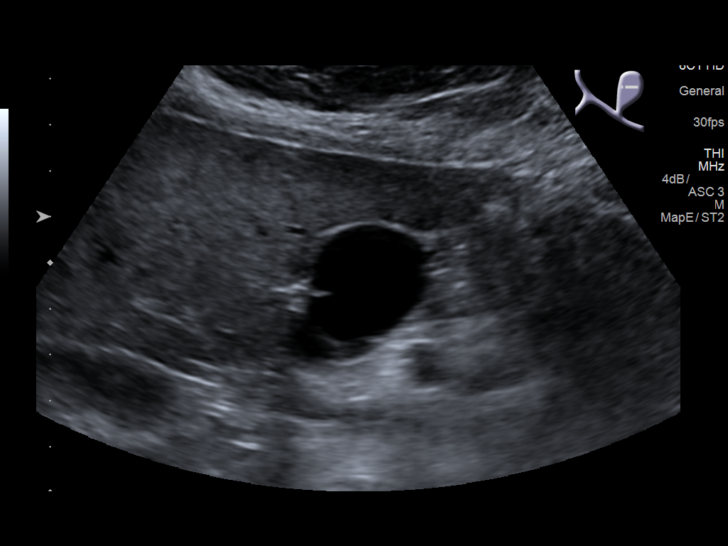
[im 21/46]
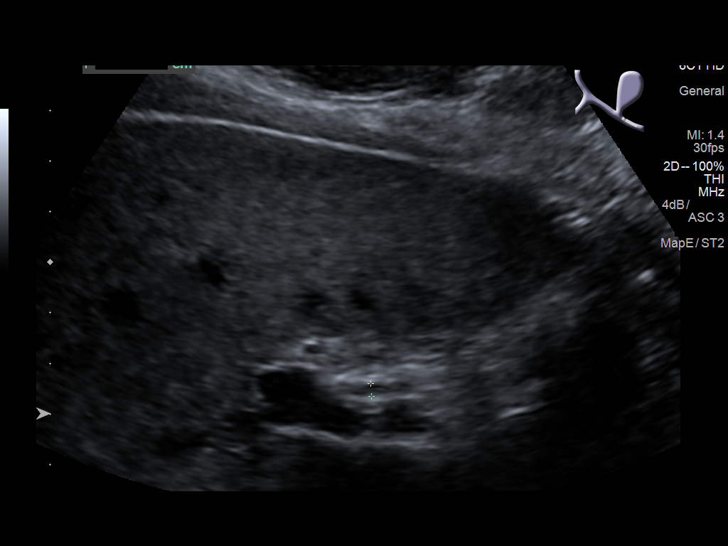
[im 25/46]
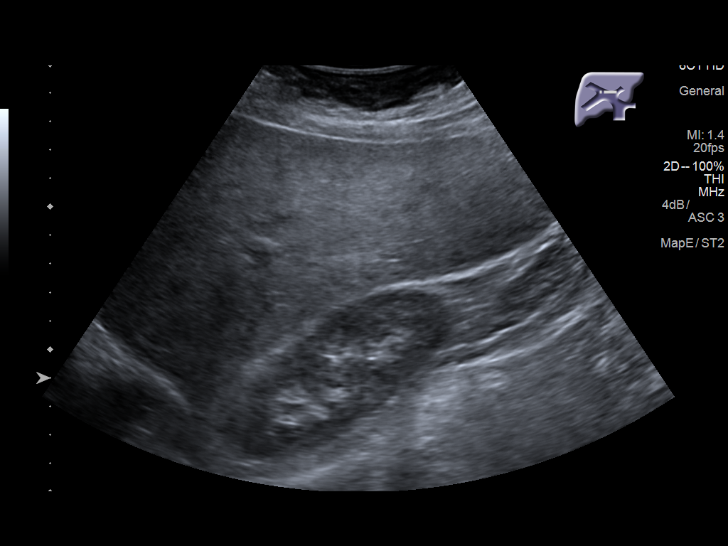
[im 29/46]
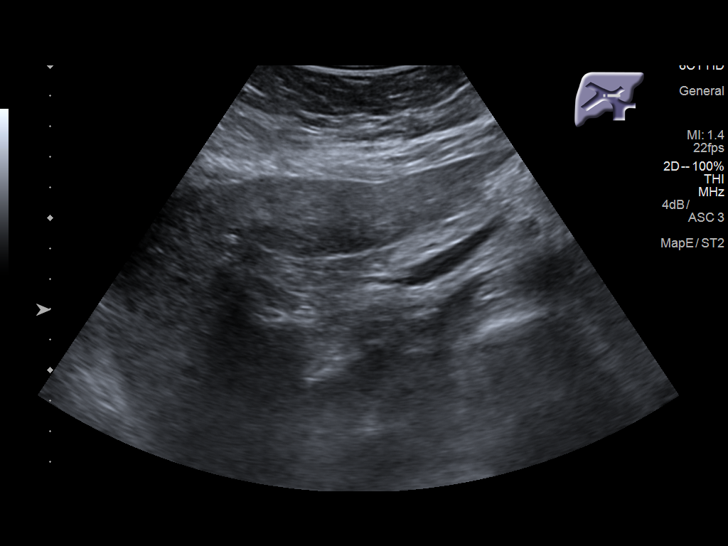
[im 31/46]
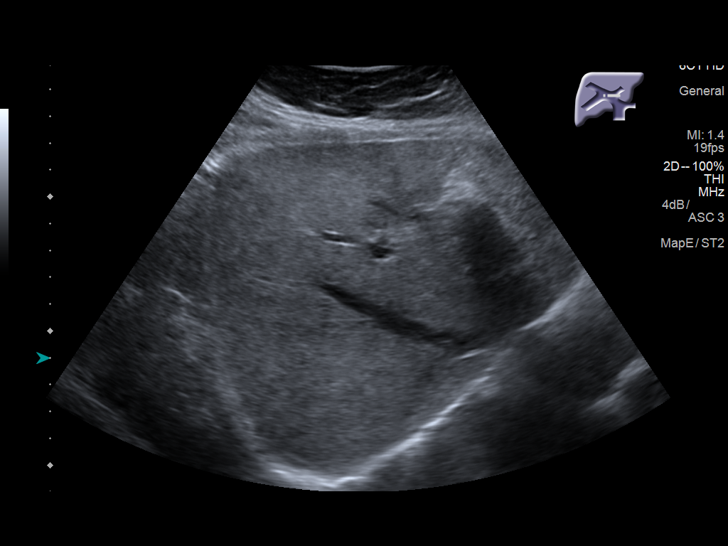
[im 34/46]
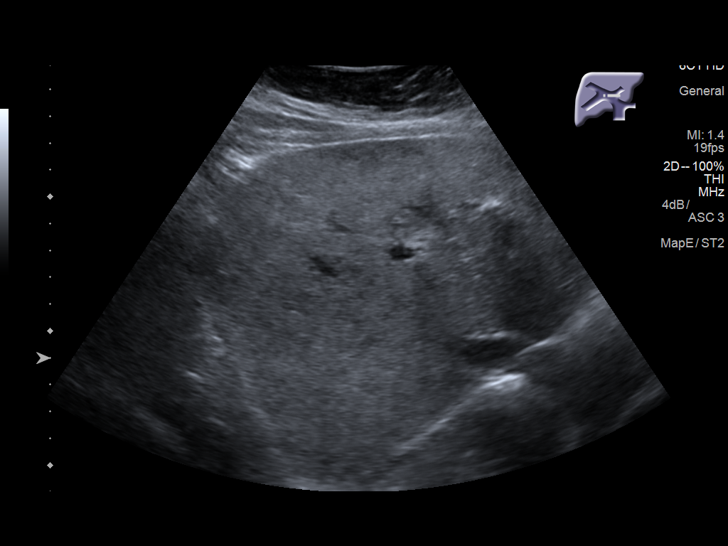
[im 38/46]
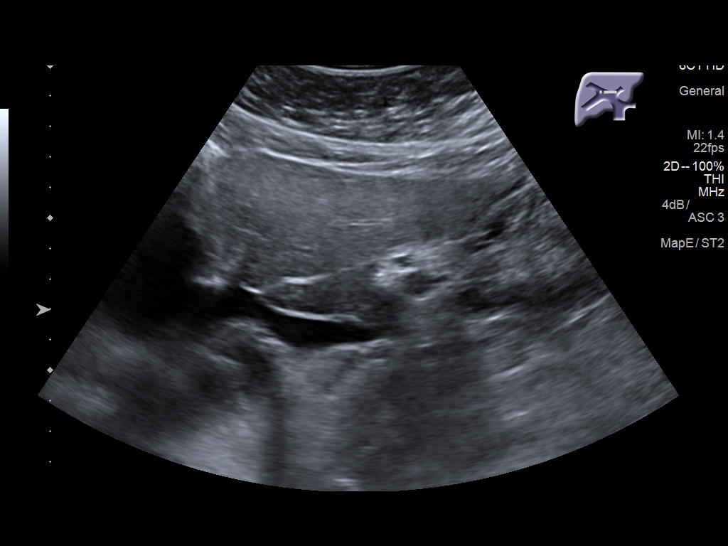
[im 42/46]
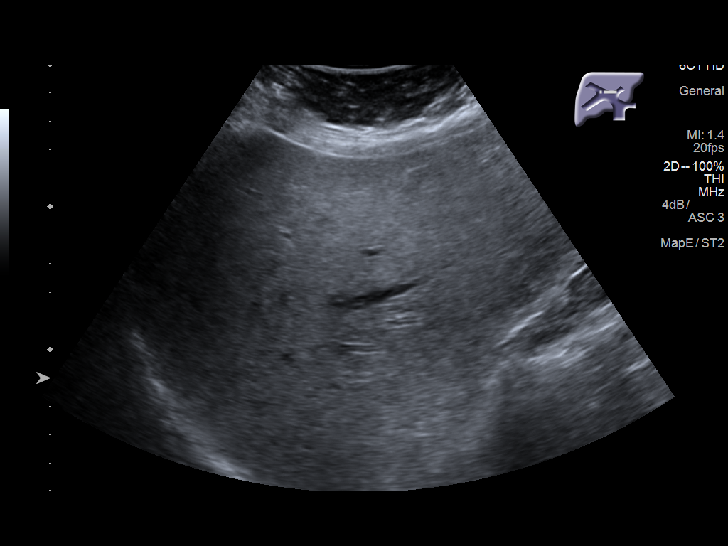
[im 46/46]
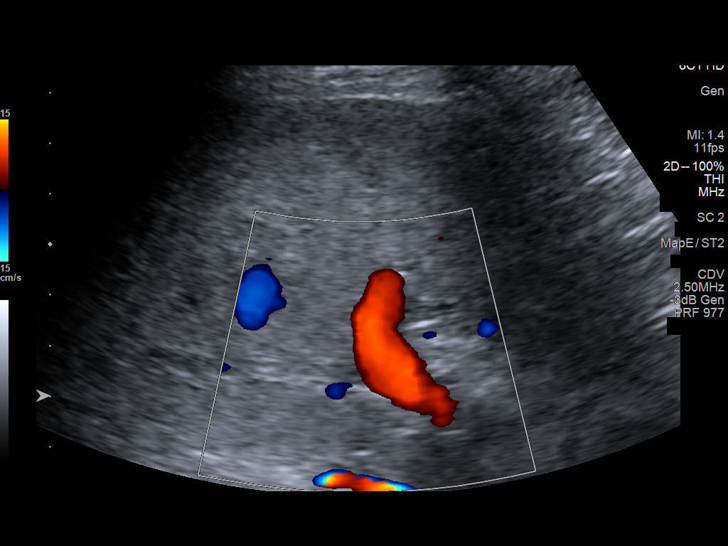

[14 of 25 positions shown; findings below may reference images not displayed]

FINDINGS: Gallbladder:

The gallbladder is adequately distended. There is a stone, measuring
2.4 cm in longest dimension, with prominent distal shadowing. There
is no gallbladder wall thickening, pericholecystic fluid or
sonographic Murphy's sign.

Common bile duct:

Diameter: 2.5 mm

Liver:

Increased echogenicity. No focal masses. Portal vein is patent on
color Doppler imaging with normal direction of blood flow towards
the liver.
IMPRESSION: Cholelithiasis without signs of acute cholecystitis.

Similar appearance to sonographic evaluation on 02/02/2018.

## 2019-01-04 DIAGNOSIS — J4520 Mild intermittent asthma, uncomplicated: Secondary | ICD-10-CM | POA: Diagnosis not present

## 2019-01-04 DIAGNOSIS — Z0000 Encounter for general adult medical examination without abnormal findings: Secondary | ICD-10-CM | POA: Diagnosis not present

## 2019-01-04 DIAGNOSIS — E7849 Other hyperlipidemia: Secondary | ICD-10-CM | POA: Diagnosis not present

## 2019-01-04 DIAGNOSIS — I10 Essential (primary) hypertension: Secondary | ICD-10-CM | POA: Diagnosis not present

## 2019-01-04 DIAGNOSIS — Z1389 Encounter for screening for other disorder: Secondary | ICD-10-CM | POA: Diagnosis not present

## 2019-01-20 DIAGNOSIS — E7849 Other hyperlipidemia: Secondary | ICD-10-CM | POA: Diagnosis not present

## 2019-01-20 DIAGNOSIS — J4520 Mild intermittent asthma, uncomplicated: Secondary | ICD-10-CM | POA: Diagnosis not present

## 2019-01-20 DIAGNOSIS — I10 Essential (primary) hypertension: Secondary | ICD-10-CM | POA: Diagnosis not present

## 2019-01-24 DIAGNOSIS — H2511 Age-related nuclear cataract, right eye: Secondary | ICD-10-CM | POA: Diagnosis not present

## 2019-01-24 DIAGNOSIS — H2513 Age-related nuclear cataract, bilateral: Secondary | ICD-10-CM | POA: Diagnosis not present

## 2019-02-17 DIAGNOSIS — E7849 Other hyperlipidemia: Secondary | ICD-10-CM | POA: Diagnosis not present

## 2019-02-17 DIAGNOSIS — I10 Essential (primary) hypertension: Secondary | ICD-10-CM | POA: Diagnosis not present

## 2019-02-17 DIAGNOSIS — J4520 Mild intermittent asthma, uncomplicated: Secondary | ICD-10-CM | POA: Diagnosis not present

## 2019-02-23 IMAGING — CT CT ABD-PELV W/ CM
2 of 5 series · 16 of 46 positions shown, 18 images · IV contrast (Omni 300)
Comparison: Right upper quadrant ultrasound earlier this day.

CLINICAL DATA: Acute abdominal pain. Worsening pain for 1 week,
worse in the right upper quadrant.

EXAM:
CT ABDOMEN AND PELVIS WITH CONTRAST
TECHNIQUE: Multidetector CT imaging of the abdomen and pelvis was performed
using the standard protocol following bolus administration of
intravenous contrast.
CONTRAST:  100mL OMNIPAQUE IOHEXOL 300 MG/ML  SOLN

[Series 3: a/p w/ 5mm · axial · 0.92mm/px · z∈[+805,+1195]mm · 13 of 88 slices shown, 15 images]
[im 5/88  soft-tissue]
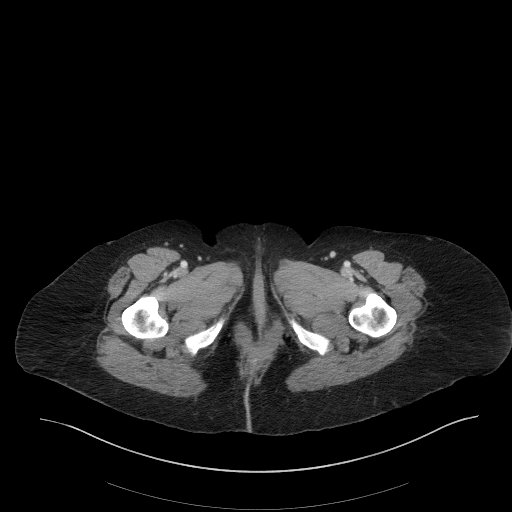
[im 5/88  bone]
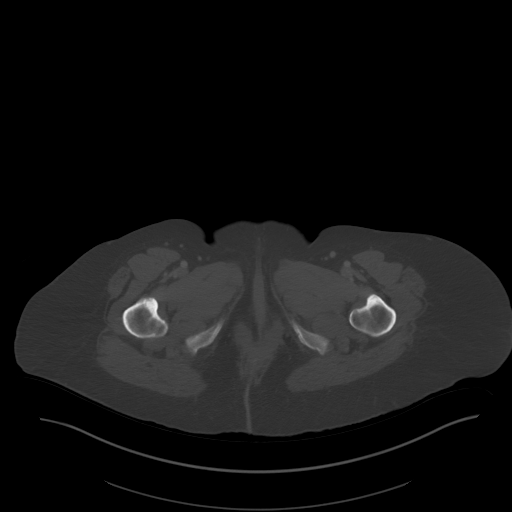
[im 14/88  soft-tissue]
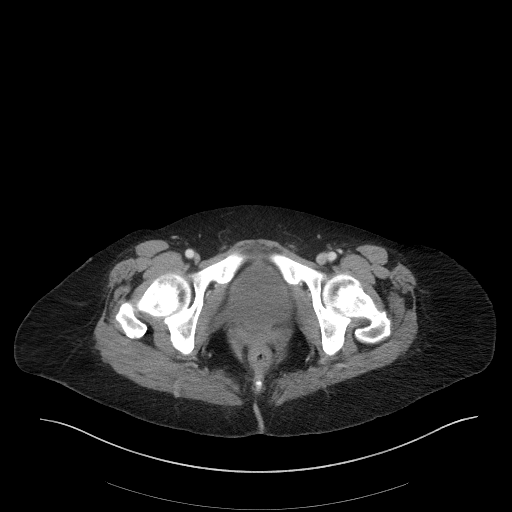
[im 18/88  soft-tissue]
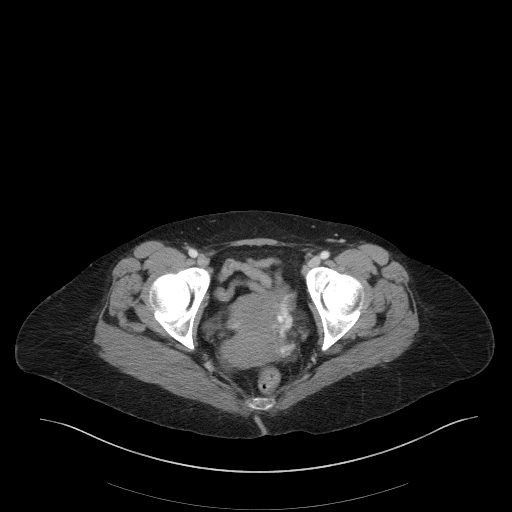
[im 27/88  soft-tissue]
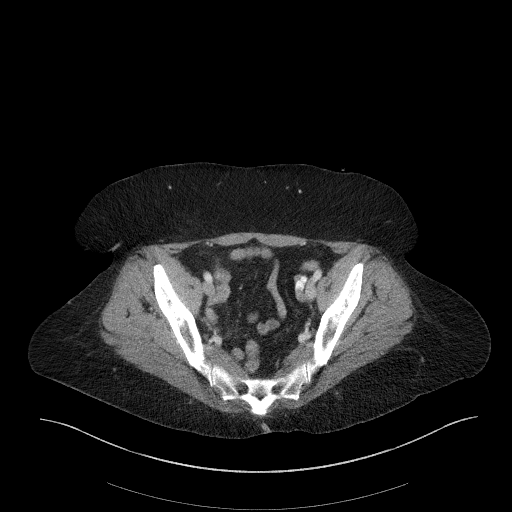
[im 31/88  soft-tissue]
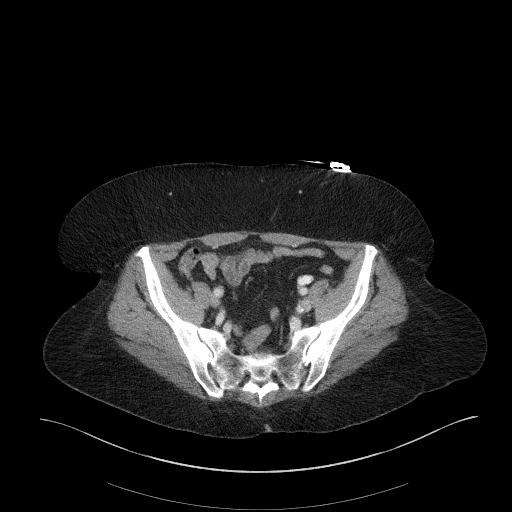
[im 40/88  soft-tissue]
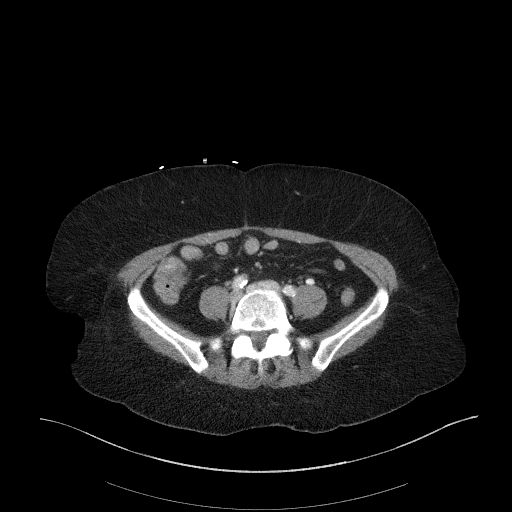
[im 44/88  soft-tissue]
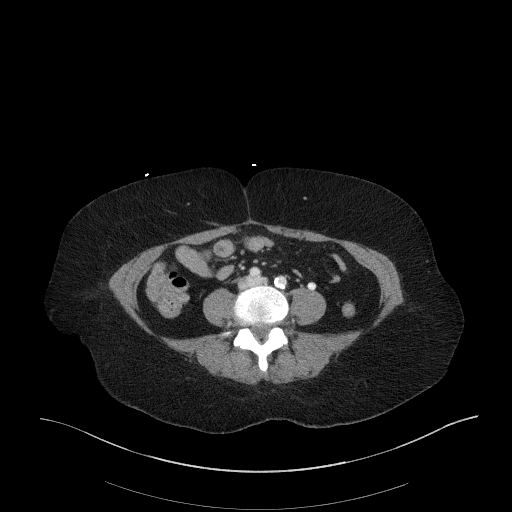
[im 48/88  soft-tissue]
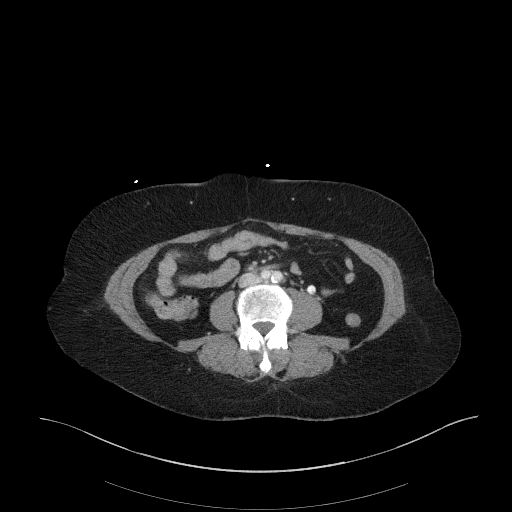
[im 57/88  soft-tissue]
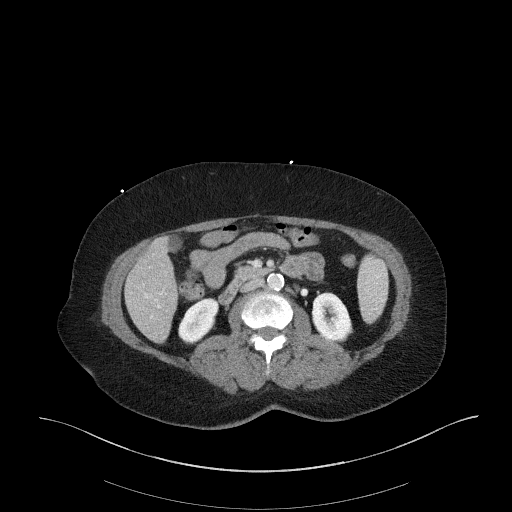
[im 57/88  bone]
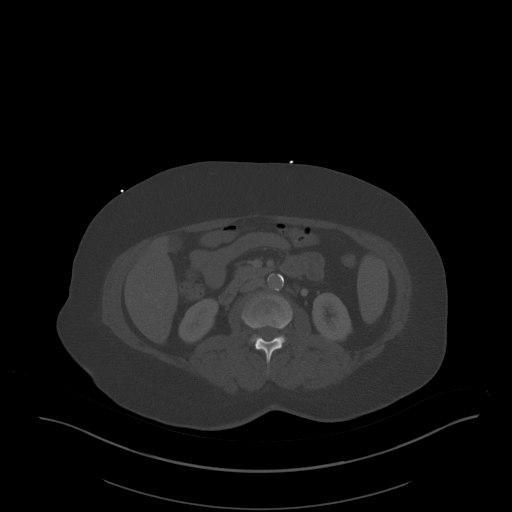
[im 61/88  soft-tissue]
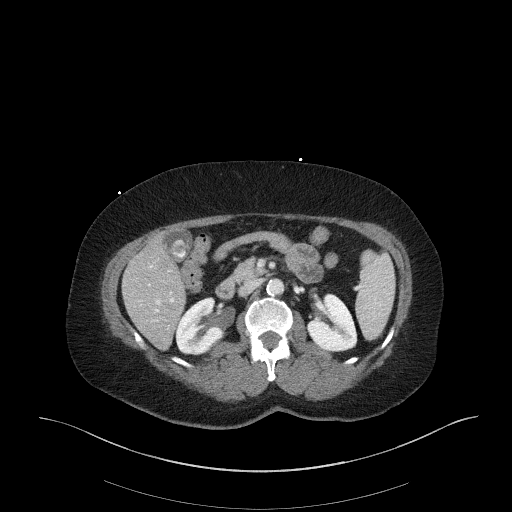
[im 70/88  soft-tissue]
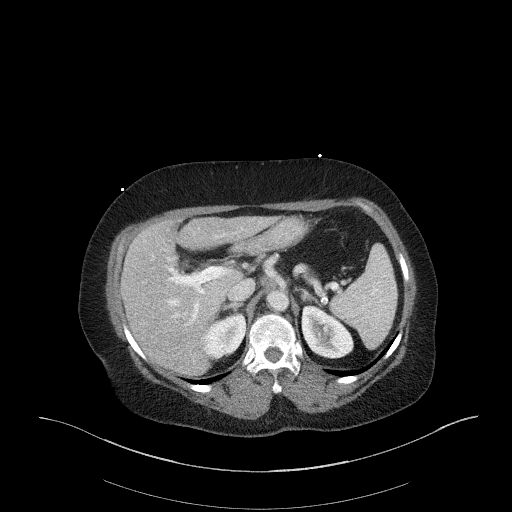
[im 74/88  soft-tissue]
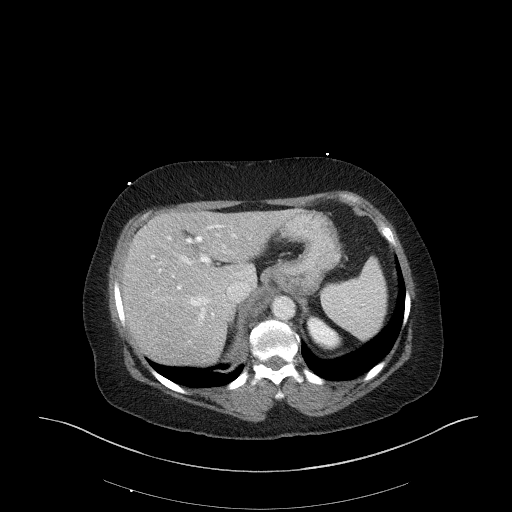
[im 83/88  soft-tissue]
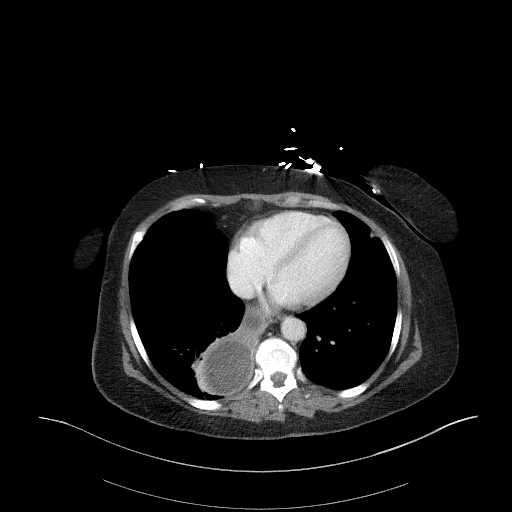

[Series 6: a/p w/ cor · coronal · 0.79mm/px · 3 of 150 slices shown]
[im 50/150  soft-tissue]
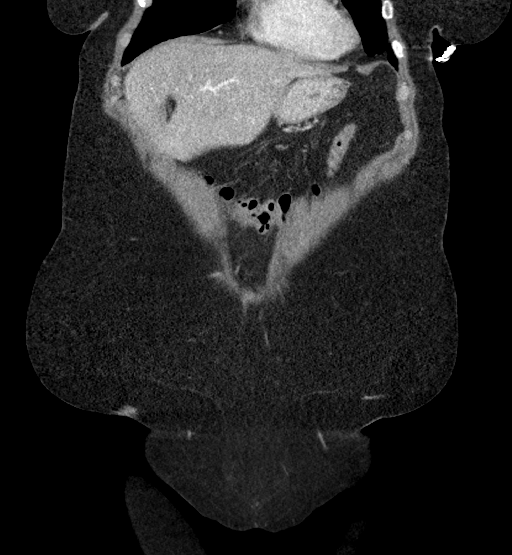
[im 67/150  soft-tissue]
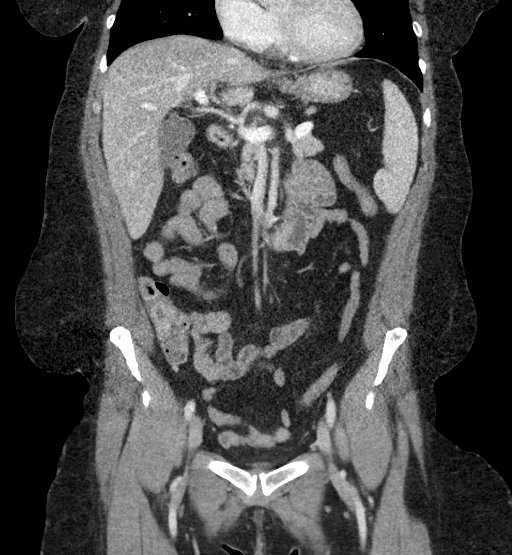
[im 83/150  soft-tissue]
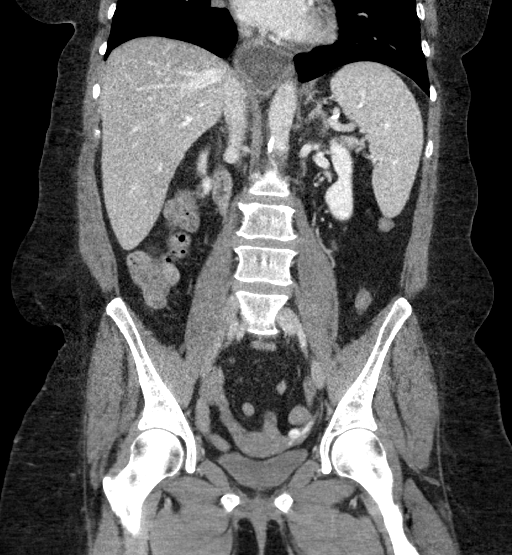

[16 of 46 positions shown; findings below may reference images not displayed]

FINDINGS: Lower chest: Distended fluid-filled included esophagus with wall
thickening, transition of the gastroesophageal junction. Right lower
lobe atelectasis or scarring adjacent to dilated esophagus. Lingular
opacity may be atelectasis or scarring.

Hepatobiliary: No focal hepatic lesion. Mild steatosis. Gallstones
in the gallbladder without pericholecystic inflammation. No biliary
dilatation.

Pancreas: No ductal dilatation or inflammation.

Spleen: Elongated spanning 12.9 cm.  No focal splenic abnormality.

Adrenals/Urinary Tract: Normal adrenal glands. Prominence of the
right renal pelvis without calyceal dilatation consistent without
chin renal pelvis configuration. No perinephric edema. Homogeneous
renal enhancement. Small cyst in the upper left kidney. Ureters are
decompressed. Urinary bladder is nondistended. No bladder wall
thickening.

Stomach/Bowel: Stomach is nondistended limiting assessment. No small
or large bowel wall thickening, inflammatory change or obstruction.
Small volume of colonic stool. Normal appendix.

Vascular/Lymphatic: Moderate to advanced aortic atherosclerosis. No
aneurysm. Prominent left adnexal and periuterine vascularity with
dilatation of the left ovarian vein measuring 9 mm. Small
retrocrural nodes measure up to 6 mm, nonspecific. No enlarged
abdominal or pelvic lymph nodes.

Reproductive: Increased left adnexal and periuterine vascularity.
Uterus is otherwise unremarkable. No adnexal mass.

Other: No free air, free fluid, or intra-abdominal fluid collection.

Musculoskeletal: There are no acute or suspicious osseous
abnormalities. Degenerative disc disease at L5-S1.
IMPRESSION: 1. Dilated fluid-filled esophagus with transition at the
gastroesophageal junction. Findings may be secondary to achalasia,
distal esophageal structure or focal mass not visualized by CT.
Recommend endoscopic evaluation.
2. Increased left adnexal vascularity and dilatation of the ovarian
veins, can be seen with pelvic congestion syndrome.
3. Cholelithiasis.  Aortic Atherosclerosis ([id number]).

## 2019-02-24 DIAGNOSIS — H2511 Age-related nuclear cataract, right eye: Secondary | ICD-10-CM | POA: Diagnosis not present

## 2019-03-07 DIAGNOSIS — H2512 Age-related nuclear cataract, left eye: Secondary | ICD-10-CM | POA: Diagnosis not present

## 2019-03-21 DIAGNOSIS — I10 Essential (primary) hypertension: Secondary | ICD-10-CM | POA: Diagnosis not present

## 2019-03-21 DIAGNOSIS — J4520 Mild intermittent asthma, uncomplicated: Secondary | ICD-10-CM | POA: Diagnosis not present

## 2019-03-21 DIAGNOSIS — E7849 Other hyperlipidemia: Secondary | ICD-10-CM | POA: Diagnosis not present

## 2019-04-17 DIAGNOSIS — I10 Essential (primary) hypertension: Secondary | ICD-10-CM | POA: Diagnosis not present

## 2019-04-17 DIAGNOSIS — J4520 Mild intermittent asthma, uncomplicated: Secondary | ICD-10-CM | POA: Diagnosis not present

## 2019-04-17 DIAGNOSIS — E7849 Other hyperlipidemia: Secondary | ICD-10-CM | POA: Diagnosis not present

## 2019-05-08 DIAGNOSIS — E7849 Other hyperlipidemia: Secondary | ICD-10-CM | POA: Diagnosis not present

## 2019-05-08 DIAGNOSIS — I10 Essential (primary) hypertension: Secondary | ICD-10-CM | POA: Diagnosis not present

## 2019-05-08 DIAGNOSIS — J4520 Mild intermittent asthma, uncomplicated: Secondary | ICD-10-CM | POA: Diagnosis not present

## 2019-05-08 DIAGNOSIS — Z0000 Encounter for general adult medical examination without abnormal findings: Secondary | ICD-10-CM | POA: Diagnosis not present

## 2019-06-20 DIAGNOSIS — I10 Essential (primary) hypertension: Secondary | ICD-10-CM | POA: Diagnosis not present

## 2019-06-20 DIAGNOSIS — E7849 Other hyperlipidemia: Secondary | ICD-10-CM | POA: Diagnosis not present

## 2019-06-21 DIAGNOSIS — H43811 Vitreous degeneration, right eye: Secondary | ICD-10-CM | POA: Diagnosis not present

## 2019-07-19 DIAGNOSIS — H43811 Vitreous degeneration, right eye: Secondary | ICD-10-CM | POA: Diagnosis not present

## 2019-07-19 DIAGNOSIS — I10 Essential (primary) hypertension: Secondary | ICD-10-CM | POA: Diagnosis not present

## 2019-07-19 DIAGNOSIS — E7849 Other hyperlipidemia: Secondary | ICD-10-CM | POA: Diagnosis not present

## 2019-08-21 DIAGNOSIS — I10 Essential (primary) hypertension: Secondary | ICD-10-CM | POA: Diagnosis not present

## 2019-08-21 DIAGNOSIS — E7849 Other hyperlipidemia: Secondary | ICD-10-CM | POA: Diagnosis not present

## 2019-09-06 DIAGNOSIS — E7849 Other hyperlipidemia: Secondary | ICD-10-CM | POA: Diagnosis not present

## 2019-09-06 DIAGNOSIS — I10 Essential (primary) hypertension: Secondary | ICD-10-CM | POA: Diagnosis not present

## 2019-09-06 DIAGNOSIS — Z0000 Encounter for general adult medical examination without abnormal findings: Secondary | ICD-10-CM | POA: Diagnosis not present

## 2019-09-06 DIAGNOSIS — J4520 Mild intermittent asthma, uncomplicated: Secondary | ICD-10-CM | POA: Diagnosis not present

## 2019-09-29 DIAGNOSIS — J4520 Mild intermittent asthma, uncomplicated: Secondary | ICD-10-CM | POA: Diagnosis not present

## 2019-09-29 DIAGNOSIS — I10 Essential (primary) hypertension: Secondary | ICD-10-CM | POA: Diagnosis not present

## 2019-09-29 DIAGNOSIS — E7849 Other hyperlipidemia: Secondary | ICD-10-CM | POA: Diagnosis not present

## 2019-11-01 DIAGNOSIS — J4520 Mild intermittent asthma, uncomplicated: Secondary | ICD-10-CM | POA: Diagnosis not present

## 2019-11-01 DIAGNOSIS — I10 Essential (primary) hypertension: Secondary | ICD-10-CM | POA: Diagnosis not present

## 2019-11-01 DIAGNOSIS — E7849 Other hyperlipidemia: Secondary | ICD-10-CM | POA: Diagnosis not present

## 2019-12-19 DIAGNOSIS — E7849 Other hyperlipidemia: Secondary | ICD-10-CM | POA: Diagnosis not present

## 2019-12-19 DIAGNOSIS — I10 Essential (primary) hypertension: Secondary | ICD-10-CM | POA: Diagnosis not present

## 2019-12-19 DIAGNOSIS — J4520 Mild intermittent asthma, uncomplicated: Secondary | ICD-10-CM | POA: Diagnosis not present

## 2020-01-16 DIAGNOSIS — J4520 Mild intermittent asthma, uncomplicated: Secondary | ICD-10-CM | POA: Diagnosis not present

## 2020-01-16 DIAGNOSIS — I10 Essential (primary) hypertension: Secondary | ICD-10-CM | POA: Diagnosis not present

## 2020-01-16 DIAGNOSIS — E7849 Other hyperlipidemia: Secondary | ICD-10-CM | POA: Diagnosis not present

## 2020-02-08 DIAGNOSIS — E7849 Other hyperlipidemia: Secondary | ICD-10-CM | POA: Diagnosis not present

## 2020-02-08 DIAGNOSIS — I10 Essential (primary) hypertension: Secondary | ICD-10-CM | POA: Diagnosis not present

## 2020-02-08 DIAGNOSIS — J4520 Mild intermittent asthma, uncomplicated: Secondary | ICD-10-CM | POA: Diagnosis not present

## 2020-02-08 DIAGNOSIS — Z0000 Encounter for general adult medical examination without abnormal findings: Secondary | ICD-10-CM | POA: Diagnosis not present

## 2020-02-26 DIAGNOSIS — E7849 Other hyperlipidemia: Secondary | ICD-10-CM | POA: Diagnosis not present

## 2020-02-26 DIAGNOSIS — I10 Essential (primary) hypertension: Secondary | ICD-10-CM | POA: Diagnosis not present

## 2020-02-26 DIAGNOSIS — J4520 Mild intermittent asthma, uncomplicated: Secondary | ICD-10-CM | POA: Diagnosis not present

## 2020-03-25 DIAGNOSIS — I10 Essential (primary) hypertension: Secondary | ICD-10-CM | POA: Diagnosis not present

## 2020-03-25 DIAGNOSIS — J4520 Mild intermittent asthma, uncomplicated: Secondary | ICD-10-CM | POA: Diagnosis not present

## 2020-03-25 DIAGNOSIS — E7849 Other hyperlipidemia: Secondary | ICD-10-CM | POA: Diagnosis not present

## 2020-04-26 DIAGNOSIS — J4520 Mild intermittent asthma, uncomplicated: Secondary | ICD-10-CM | POA: Diagnosis not present

## 2020-04-26 DIAGNOSIS — I10 Essential (primary) hypertension: Secondary | ICD-10-CM | POA: Diagnosis not present

## 2020-04-26 DIAGNOSIS — E7849 Other hyperlipidemia: Secondary | ICD-10-CM | POA: Diagnosis not present

## 2020-05-30 DIAGNOSIS — E7849 Other hyperlipidemia: Secondary | ICD-10-CM | POA: Diagnosis not present

## 2020-05-30 DIAGNOSIS — I10 Essential (primary) hypertension: Secondary | ICD-10-CM | POA: Diagnosis not present

## 2020-05-30 DIAGNOSIS — J4520 Mild intermittent asthma, uncomplicated: Secondary | ICD-10-CM | POA: Diagnosis not present

## 2020-06-11 DIAGNOSIS — J4520 Mild intermittent asthma, uncomplicated: Secondary | ICD-10-CM | POA: Diagnosis not present

## 2020-06-11 DIAGNOSIS — E7849 Other hyperlipidemia: Secondary | ICD-10-CM | POA: Diagnosis not present

## 2020-06-11 DIAGNOSIS — I10 Essential (primary) hypertension: Secondary | ICD-10-CM | POA: Diagnosis not present

## 2020-06-26 DIAGNOSIS — I10 Essential (primary) hypertension: Secondary | ICD-10-CM | POA: Diagnosis not present

## 2020-06-26 DIAGNOSIS — E7849 Other hyperlipidemia: Secondary | ICD-10-CM | POA: Diagnosis not present

## 2020-08-28 DIAGNOSIS — J4520 Mild intermittent asthma, uncomplicated: Secondary | ICD-10-CM | POA: Diagnosis not present

## 2020-08-28 DIAGNOSIS — E7849 Other hyperlipidemia: Secondary | ICD-10-CM | POA: Diagnosis not present

## 2020-08-28 DIAGNOSIS — I10 Essential (primary) hypertension: Secondary | ICD-10-CM | POA: Diagnosis not present

## 2020-08-28 DIAGNOSIS — Z6828 Body mass index (BMI) 28.0-28.9, adult: Secondary | ICD-10-CM | POA: Diagnosis not present

## 2020-08-28 DIAGNOSIS — L279 Dermatitis due to unspecified substance taken internally: Secondary | ICD-10-CM | POA: Diagnosis not present

## 2020-10-09 DIAGNOSIS — Z0100 Encounter for examination of eyes and vision without abnormal findings: Secondary | ICD-10-CM | POA: Diagnosis not present

## 2020-10-11 DIAGNOSIS — H524 Presbyopia: Secondary | ICD-10-CM | POA: Diagnosis not present

## 2020-11-25 DIAGNOSIS — L279 Dermatitis due to unspecified substance taken internally: Secondary | ICD-10-CM | POA: Diagnosis not present

## 2020-11-25 DIAGNOSIS — I10 Essential (primary) hypertension: Secondary | ICD-10-CM | POA: Diagnosis not present

## 2020-11-25 DIAGNOSIS — Z0000 Encounter for general adult medical examination without abnormal findings: Secondary | ICD-10-CM | POA: Diagnosis not present

## 2020-11-25 DIAGNOSIS — Z6828 Body mass index (BMI) 28.0-28.9, adult: Secondary | ICD-10-CM | POA: Diagnosis not present

## 2020-11-25 DIAGNOSIS — E7849 Other hyperlipidemia: Secondary | ICD-10-CM | POA: Diagnosis not present

## 2020-11-25 DIAGNOSIS — J4520 Mild intermittent asthma, uncomplicated: Secondary | ICD-10-CM | POA: Diagnosis not present

## 2020-12-04 DIAGNOSIS — H26493 Other secondary cataract, bilateral: Secondary | ICD-10-CM | POA: Diagnosis not present

## 2020-12-20 DIAGNOSIS — H26492 Other secondary cataract, left eye: Secondary | ICD-10-CM | POA: Diagnosis not present

## 2021-01-07 DIAGNOSIS — H26491 Other secondary cataract, right eye: Secondary | ICD-10-CM | POA: Diagnosis not present

## 2021-04-11 DIAGNOSIS — E7849 Other hyperlipidemia: Secondary | ICD-10-CM | POA: Diagnosis not present

## 2021-04-11 DIAGNOSIS — I10 Essential (primary) hypertension: Secondary | ICD-10-CM | POA: Diagnosis not present

## 2021-04-11 DIAGNOSIS — J4520 Mild intermittent asthma, uncomplicated: Secondary | ICD-10-CM | POA: Diagnosis not present

## 2021-04-11 DIAGNOSIS — I700 Atherosclerosis of aorta: Secondary | ICD-10-CM | POA: Diagnosis not present

## 2021-05-29 DIAGNOSIS — J4520 Mild intermittent asthma, uncomplicated: Secondary | ICD-10-CM | POA: Diagnosis not present

## 2021-05-29 DIAGNOSIS — L279 Dermatitis due to unspecified substance taken internally: Secondary | ICD-10-CM | POA: Diagnosis not present

## 2021-05-29 DIAGNOSIS — I10 Essential (primary) hypertension: Secondary | ICD-10-CM | POA: Diagnosis not present

## 2021-05-29 DIAGNOSIS — E7849 Other hyperlipidemia: Secondary | ICD-10-CM | POA: Diagnosis not present

## 2021-05-29 DIAGNOSIS — Z0000 Encounter for general adult medical examination without abnormal findings: Secondary | ICD-10-CM | POA: Diagnosis not present

## 2021-05-29 DIAGNOSIS — Z6829 Body mass index (BMI) 29.0-29.9, adult: Secondary | ICD-10-CM | POA: Diagnosis not present

## 2021-05-29 DIAGNOSIS — I700 Atherosclerosis of aorta: Secondary | ICD-10-CM | POA: Diagnosis not present

## 2021-06-11 DIAGNOSIS — E7849 Other hyperlipidemia: Secondary | ICD-10-CM | POA: Diagnosis not present

## 2021-06-11 DIAGNOSIS — I10 Essential (primary) hypertension: Secondary | ICD-10-CM | POA: Diagnosis not present

## 2021-06-11 DIAGNOSIS — I700 Atherosclerosis of aorta: Secondary | ICD-10-CM | POA: Diagnosis not present

## 2021-09-25 DIAGNOSIS — I10 Essential (primary) hypertension: Secondary | ICD-10-CM | POA: Diagnosis not present

## 2021-09-25 DIAGNOSIS — J4520 Mild intermittent asthma, uncomplicated: Secondary | ICD-10-CM | POA: Diagnosis not present

## 2021-09-25 DIAGNOSIS — I700 Atherosclerosis of aorta: Secondary | ICD-10-CM | POA: Diagnosis not present

## 2021-09-25 DIAGNOSIS — Z6828 Body mass index (BMI) 28.0-28.9, adult: Secondary | ICD-10-CM | POA: Diagnosis not present

## 2021-09-25 DIAGNOSIS — E7849 Other hyperlipidemia: Secondary | ICD-10-CM | POA: Diagnosis not present

## 2021-10-10 DIAGNOSIS — I700 Atherosclerosis of aorta: Secondary | ICD-10-CM | POA: Diagnosis not present

## 2021-10-10 DIAGNOSIS — I10 Essential (primary) hypertension: Secondary | ICD-10-CM | POA: Diagnosis not present

## 2021-10-10 DIAGNOSIS — E7849 Other hyperlipidemia: Secondary | ICD-10-CM | POA: Diagnosis not present

## 2022-02-02 DIAGNOSIS — Z6827 Body mass index (BMI) 27.0-27.9, adult: Secondary | ICD-10-CM | POA: Diagnosis not present

## 2022-02-02 DIAGNOSIS — I10 Essential (primary) hypertension: Secondary | ICD-10-CM | POA: Diagnosis not present

## 2022-02-02 DIAGNOSIS — I700 Atherosclerosis of aorta: Secondary | ICD-10-CM | POA: Diagnosis not present

## 2022-02-02 DIAGNOSIS — J4520 Mild intermittent asthma, uncomplicated: Secondary | ICD-10-CM | POA: Diagnosis not present

## 2022-02-02 DIAGNOSIS — E7849 Other hyperlipidemia: Secondary | ICD-10-CM | POA: Diagnosis not present

## 2022-02-05 DIAGNOSIS — J4520 Mild intermittent asthma, uncomplicated: Secondary | ICD-10-CM | POA: Diagnosis not present

## 2022-02-05 DIAGNOSIS — E7849 Other hyperlipidemia: Secondary | ICD-10-CM | POA: Diagnosis not present

## 2022-02-05 DIAGNOSIS — I10 Essential (primary) hypertension: Secondary | ICD-10-CM | POA: Diagnosis not present

## 2022-02-05 DIAGNOSIS — I700 Atherosclerosis of aorta: Secondary | ICD-10-CM | POA: Diagnosis not present

## 2022-02-11 DIAGNOSIS — Z6827 Body mass index (BMI) 27.0-27.9, adult: Secondary | ICD-10-CM | POA: Diagnosis not present

## 2022-02-11 DIAGNOSIS — L723 Sebaceous cyst: Secondary | ICD-10-CM | POA: Diagnosis not present

## 2022-03-24 DIAGNOSIS — H35363 Drusen (degenerative) of macula, bilateral: Secondary | ICD-10-CM | POA: Diagnosis not present

## 2022-03-24 DIAGNOSIS — Z0100 Encounter for examination of eyes and vision without abnormal findings: Secondary | ICD-10-CM | POA: Diagnosis not present

## 2022-03-24 DIAGNOSIS — H5213 Myopia, bilateral: Secondary | ICD-10-CM | POA: Diagnosis not present

## 2022-03-24 DIAGNOSIS — H35343 Macular cyst, hole, or pseudohole, bilateral: Secondary | ICD-10-CM | POA: Diagnosis not present

## 2022-03-24 DIAGNOSIS — Z135 Encounter for screening for eye and ear disorders: Secondary | ICD-10-CM | POA: Diagnosis not present

## 2022-03-24 DIAGNOSIS — H5203 Hypermetropia, bilateral: Secondary | ICD-10-CM | POA: Diagnosis not present

## 2022-06-25 DIAGNOSIS — E7849 Other hyperlipidemia: Secondary | ICD-10-CM | POA: Diagnosis not present

## 2022-06-25 DIAGNOSIS — J4520 Mild intermittent asthma, uncomplicated: Secondary | ICD-10-CM | POA: Diagnosis not present

## 2022-06-25 DIAGNOSIS — I700 Atherosclerosis of aorta: Secondary | ICD-10-CM | POA: Diagnosis not present

## 2022-06-25 DIAGNOSIS — I10 Essential (primary) hypertension: Secondary | ICD-10-CM | POA: Diagnosis not present

## 2022-06-25 DIAGNOSIS — L309 Dermatitis, unspecified: Secondary | ICD-10-CM | POA: Diagnosis not present

## 2022-06-25 DIAGNOSIS — Z6827 Body mass index (BMI) 27.0-27.9, adult: Secondary | ICD-10-CM | POA: Diagnosis not present

## 2022-08-11 DIAGNOSIS — H35353 Cystoid macular degeneration, bilateral: Secondary | ICD-10-CM | POA: Diagnosis not present

## 2022-09-01 DIAGNOSIS — H30123 Disseminated chorioretinal inflammation, peripheral, bilateral: Secondary | ICD-10-CM | POA: Diagnosis not present

## 2022-09-01 DIAGNOSIS — H35353 Cystoid macular degeneration, bilateral: Secondary | ICD-10-CM | POA: Diagnosis not present

## 2022-09-01 DIAGNOSIS — H3552 Pigmentary retinal dystrophy: Secondary | ICD-10-CM | POA: Diagnosis not present

## 2022-11-11 DIAGNOSIS — J4520 Mild intermittent asthma, uncomplicated: Secondary | ICD-10-CM | POA: Diagnosis not present

## 2022-11-11 DIAGNOSIS — Z6827 Body mass index (BMI) 27.0-27.9, adult: Secondary | ICD-10-CM | POA: Diagnosis not present

## 2022-11-11 DIAGNOSIS — I700 Atherosclerosis of aorta: Secondary | ICD-10-CM | POA: Diagnosis not present

## 2022-11-11 DIAGNOSIS — I10 Essential (primary) hypertension: Secondary | ICD-10-CM | POA: Diagnosis not present

## 2022-11-11 DIAGNOSIS — L309 Dermatitis, unspecified: Secondary | ICD-10-CM | POA: Diagnosis not present

## 2022-11-11 DIAGNOSIS — E7849 Other hyperlipidemia: Secondary | ICD-10-CM | POA: Diagnosis not present

## 2023-02-11 DIAGNOSIS — J4520 Mild intermittent asthma, uncomplicated: Secondary | ICD-10-CM | POA: Diagnosis not present

## 2023-02-11 DIAGNOSIS — E7849 Other hyperlipidemia: Secondary | ICD-10-CM | POA: Diagnosis not present

## 2023-02-11 DIAGNOSIS — I10 Essential (primary) hypertension: Secondary | ICD-10-CM | POA: Diagnosis not present

## 2023-02-11 DIAGNOSIS — Z6827 Body mass index (BMI) 27.0-27.9, adult: Secondary | ICD-10-CM | POA: Diagnosis not present

## 2023-02-11 DIAGNOSIS — I700 Atherosclerosis of aorta: Secondary | ICD-10-CM | POA: Diagnosis not present

## 2023-03-04 DIAGNOSIS — H30123 Disseminated chorioretinal inflammation, peripheral, bilateral: Secondary | ICD-10-CM | POA: Diagnosis not present

## 2023-03-04 DIAGNOSIS — H3552 Pigmentary retinal dystrophy: Secondary | ICD-10-CM | POA: Diagnosis not present

## 2023-04-06 DIAGNOSIS — H30123 Disseminated chorioretinal inflammation, peripheral, bilateral: Secondary | ICD-10-CM | POA: Diagnosis not present

## 2023-04-15 DIAGNOSIS — H30123 Disseminated chorioretinal inflammation, peripheral, bilateral: Secondary | ICD-10-CM | POA: Diagnosis not present

## 2023-06-15 DIAGNOSIS — J4520 Mild intermittent asthma, uncomplicated: Secondary | ICD-10-CM | POA: Diagnosis not present

## 2023-06-15 DIAGNOSIS — I10 Essential (primary) hypertension: Secondary | ICD-10-CM | POA: Diagnosis not present

## 2023-06-15 DIAGNOSIS — Z6827 Body mass index (BMI) 27.0-27.9, adult: Secondary | ICD-10-CM | POA: Diagnosis not present

## 2023-06-15 DIAGNOSIS — I700 Atherosclerosis of aorta: Secondary | ICD-10-CM | POA: Diagnosis not present

## 2023-06-15 DIAGNOSIS — E7849 Other hyperlipidemia: Secondary | ICD-10-CM | POA: Diagnosis not present

## 2023-06-15 DIAGNOSIS — Z0000 Encounter for general adult medical examination without abnormal findings: Secondary | ICD-10-CM | POA: Diagnosis not present

## 2023-09-14 DIAGNOSIS — Z0000 Encounter for general adult medical examination without abnormal findings: Secondary | ICD-10-CM | POA: Diagnosis not present

## 2023-09-14 DIAGNOSIS — I10 Essential (primary) hypertension: Secondary | ICD-10-CM | POA: Diagnosis not present

## 2023-09-14 DIAGNOSIS — I700 Atherosclerosis of aorta: Secondary | ICD-10-CM | POA: Diagnosis not present

## 2023-09-14 DIAGNOSIS — Z6827 Body mass index (BMI) 27.0-27.9, adult: Secondary | ICD-10-CM | POA: Diagnosis not present

## 2023-09-14 DIAGNOSIS — E7849 Other hyperlipidemia: Secondary | ICD-10-CM | POA: Diagnosis not present

## 2023-09-14 DIAGNOSIS — J4520 Mild intermittent asthma, uncomplicated: Secondary | ICD-10-CM | POA: Diagnosis not present

## 2023-12-07 DIAGNOSIS — M810 Age-related osteoporosis without current pathological fracture: Secondary | ICD-10-CM | POA: Diagnosis not present

## 2023-12-07 DIAGNOSIS — M8589 Other specified disorders of bone density and structure, multiple sites: Secondary | ICD-10-CM | POA: Diagnosis not present

## 2023-12-15 DIAGNOSIS — I10 Essential (primary) hypertension: Secondary | ICD-10-CM | POA: Diagnosis not present

## 2023-12-15 DIAGNOSIS — N182 Chronic kidney disease, stage 2 (mild): Secondary | ICD-10-CM | POA: Diagnosis not present

## 2023-12-15 DIAGNOSIS — E7849 Other hyperlipidemia: Secondary | ICD-10-CM | POA: Diagnosis not present

## 2023-12-15 DIAGNOSIS — J4520 Mild intermittent asthma, uncomplicated: Secondary | ICD-10-CM | POA: Diagnosis not present

## 2023-12-15 DIAGNOSIS — I700 Atherosclerosis of aorta: Secondary | ICD-10-CM | POA: Diagnosis not present

## 2023-12-15 DIAGNOSIS — Z0000 Encounter for general adult medical examination without abnormal findings: Secondary | ICD-10-CM | POA: Diagnosis not present

## 2024-04-04 DIAGNOSIS — J4520 Mild intermittent asthma, uncomplicated: Secondary | ICD-10-CM | POA: Diagnosis not present

## 2024-04-04 DIAGNOSIS — I700 Atherosclerosis of aorta: Secondary | ICD-10-CM | POA: Diagnosis not present

## 2024-04-04 DIAGNOSIS — I10 Essential (primary) hypertension: Secondary | ICD-10-CM | POA: Diagnosis not present

## 2024-04-04 DIAGNOSIS — N182 Chronic kidney disease, stage 2 (mild): Secondary | ICD-10-CM | POA: Diagnosis not present

## 2024-04-04 DIAGNOSIS — E7849 Other hyperlipidemia: Secondary | ICD-10-CM | POA: Diagnosis not present
